# Patient Record
Sex: Male | Born: 1995 | Race: Black or African American | Hispanic: No | Marital: Single | Smoking: Never smoker
Health system: Southern US, Community
[De-identification: ages and names within clinical notes are randomized; demographics above are authoritative.]

## PROBLEM LIST (undated history)

## (undated) DIAGNOSIS — A549 Gonococcal infection, unspecified: Secondary | ICD-10-CM

## (undated) DIAGNOSIS — A539 Syphilis, unspecified: Secondary | ICD-10-CM

## (undated) DIAGNOSIS — B20 Human immunodeficiency virus [HIV] disease: Secondary | ICD-10-CM

## (undated) DIAGNOSIS — A749 Chlamydial infection, unspecified: Secondary | ICD-10-CM

---
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
## 2013-01-04 ENCOUNTER — Emergency Department (HOSPITAL_COMMUNITY)
Admission: EM | Admit: 2013-01-04 | Discharge: 2013-01-04 | Disposition: A | Discharge status: Home / Self Care | Payer: Medicaid Other | Attending: Emergency Medicine | Admitting: Emergency Medicine

## 2013-01-04 ENCOUNTER — Encounter (HOSPITAL_COMMUNITY): Payer: Self-pay | Admitting: *Deleted

## 2013-01-04 DIAGNOSIS — Z880 Allergy status to penicillin: Secondary | ICD-10-CM | POA: Insufficient documentation

## 2013-01-04 DIAGNOSIS — R000 Tachycardia, unspecified: Secondary | ICD-10-CM | POA: Insufficient documentation

## 2013-01-04 DIAGNOSIS — A64 Unspecified sexually transmitted disease: Secondary | ICD-10-CM | POA: Insufficient documentation

## 2013-01-04 DIAGNOSIS — K6289 Other specified diseases of anus and rectum: Secondary | ICD-10-CM | POA: Insufficient documentation

## 2013-01-04 DIAGNOSIS — Z711 Person with feared health complaint in whom no diagnosis is made: Secondary | ICD-10-CM

## 2013-01-04 LAB — URINE MICROSCOPIC-ADD ON

## 2013-01-04 LAB — URINALYSIS, ROUTINE W REFLEX MICROSCOPIC
Glucose, UA: NEGATIVE mg/dL
Ketones, ur: 40 mg/dL — AB
Leukocytes, UA: NEGATIVE
Nitrite: NEGATIVE
Protein, ur: 30 mg/dL — AB
Specific Gravity, Urine: 1.028 (ref 1.005–1.030)
Urobilinogen, UA: 1 mg/dL (ref 0.0–1.0)
pH: 6 (ref 5.0–8.0)

## 2013-01-04 MED ORDER — METRONIDAZOLE 500 MG PO TABS: 500.0000 mg | ORAL_TABLET | Freq: Two times a day (BID) | ORAL | Status: DC

## 2013-01-04 MED ORDER — OXYCODONE-ACETAMINOPHEN 5-325 MG PO TABS
2.0000 | ORAL_TABLET | Freq: Once | ORAL | Status: AC
  Administered 2013-01-04: 2 via ORAL
  Filled 2013-01-04: qty 2

## 2013-01-04 MED ORDER — CIPROFLOXACIN HCL 500 MG PO TABS: 500.0000 mg | ORAL_TABLET | Freq: Two times a day (BID) | ORAL | Status: DC

## 2013-01-04 MED ORDER — PERCOCET 5-325 MG PO TABS: 1.0000 | ORAL_TABLET | Freq: Four times a day (QID) | ORAL | Status: DC | PRN

## 2013-01-04 MED ORDER — AZITHROMYCIN 250 MG PO TABS
1000.0000 mg | ORAL_TABLET | Freq: Once | ORAL | Status: AC
  Administered 2013-01-04: 1000 mg via ORAL
  Filled 2013-01-04: qty 4

## 2013-01-04 MED ORDER — LIDOCAINE HCL (PF) 1 % IJ SOLN
INTRAMUSCULAR | Status: AC
  Administered 2013-01-04: 0.9 mL
  Filled 2013-01-04: qty 5

## 2013-01-04 MED ORDER — CEFTRIAXONE SODIUM 250 MG IJ SOLR
250.0000 mg | Freq: Once | INTRAMUSCULAR | Status: AC
  Administered 2013-01-04: 250 mg via INTRAMUSCULAR
  Filled 2013-01-04: qty 250

## 2013-01-04 NOTE — ED Provider Notes (Signed)
History     CSN: [id number]  Arrival date & time 01/04/13  4696   First MD Initiated Contact with Patient 01/04/13 0940      Chief Complaint  Patient presents with  . Rectal Pain    (Consider location/radiation/quality/duration/timing/severity/associated sxs/prior treatment) The history is provided by the patient.  Jesse Andrews is a 17 y.o. male that denies any past medical history and presents emergency department complaining of rectal pain.  Onset of symptoms began Sunday while having a bowel movement.  Patient reports that pain is worsened on the left side in comparison to the right.  He denies any blood in his stools or history of hemorrhoids.  Patient states that he is sexually active with males but has not had intercourse since October.  Patient denies any fevers, night sweats, chills, difficulty urinating, hematuria, dysuria, penile discharge, flank pain, back pain, urinary retention or abdominal pain.  History reviewed. No pertinent past medical history.  History reviewed. No pertinent past surgical history.  No family history on file.  History  Substance Use Topics  . Smoking status: Never Smoker   . Smokeless tobacco: Not on file  . Alcohol Use: Yes     Comment: occ      Review of Systems Ten systems reviewed and are negative for acute change, except as noted in the HPI.    Allergies  Penicillins  Home Medications  No current outpatient prescriptions on file.  BP 112/79  Pulse 112  Temp(Src) 98 F (36.7 C) (Oral)  Resp 18  SpO2 100%  Physical Exam  Nursing note and vitals reviewed. Constitutional: He is oriented to person, place, and time. He appears well-developed and well-nourished. No distress.  HENT:  Head: Normocephalic and atraumatic.  Eyes: Conjunctivae and EOM are normal.  Neck: Normal range of motion.  Cardiovascular:  Tachycardic.  Intact distal pulses.  No pitting edema.  Pulmonary/Chest: Effort normal.  Abdominal:  Soft  nontender abdomen.  Genitourinary:  Exam chaperoned.  No palpable fluctuance or hernias. Mild purulent drainage. GC/Chlamydia cultures sent.   Musculoskeletal: Normal range of motion.  Neurological: He is alert and oriented to person, place, and time.  Skin: Skin is warm and dry. No rash noted. He is not diaphoretic.  Psychiatric: He has a normal mood and affect. His behavior is normal.    ED Course  Procedures (including critical care time)  Labs Reviewed - No data to display No results found.   No diagnosis found.   consult to pharmacy for dc abx: Cipro & flagyl   MDM   Pt to ER c/o rectal pain w BM on Sun, gradually worsening. Pt engaged in unprotected anal sex in October, denies co-morbidities or any immunocompromised illness. No evidence of hernia or abscess on rectal exam. Cultures sent. UA without signs of UTI. Will treat for STD & dc w cipro/flagyl. Follow up w PCP. Pain medication at dc. Strict return precautions & protected sex discussed.         Jaci Carrel, [place] 01/04/13 1240  Medical screening examination/treatment/procedure(s) were conducted as a shared visit with non-physician practitioner(s) and myself.  I personally evaluated the patient during the encounter  Derwood Kaplan, MD 01/04/13 1645

## 2013-01-04 NOTE — ED Notes (Signed)
Pt has a urine cup and knows we need a urine sample 

## 2013-01-04 NOTE — ED Notes (Signed)
Pt is here with rectal pain that started on Sunday when having a bowel movement.  No bleeding

## 2013-01-05 LAB — URINE CULTURE

## 2013-01-06 LAB — GC/CHLAMYDIA PROBE AMP
CT Probe RNA: POSITIVE — AB
GC Probe RNA: POSITIVE — AB

## 2013-01-07 ENCOUNTER — Telehealth (HOSPITAL_COMMUNITY): Payer: Self-pay | Admitting: Emergency Medicine

## 2013-01-07 NOTE — ED Notes (Signed)
+  Chlamydia. +Gonorrhea. Patient treated with Rocephin and Zithromax. [employer] faxed. 

## 2013-01-07 NOTE — ED Notes (Signed)
Patient has +Chalmydia and +Gonorrhea.

## 2013-08-17 DIAGNOSIS — B20 Human immunodeficiency virus [HIV] disease: Secondary | ICD-10-CM

## 2013-08-17 HISTORY — DX: Human immunodeficiency virus (HIV) disease: B20

## 2013-11-16 ENCOUNTER — Emergency Department (HOSPITAL_COMMUNITY)
Admission: EM | Admit: 2013-11-16 | Discharge: 2013-11-16 | Disposition: A | Discharge status: Home / Self Care | Payer: BC Managed Care – PPO | Attending: Emergency Medicine | Admitting: Emergency Medicine

## 2013-11-16 ENCOUNTER — Encounter (HOSPITAL_COMMUNITY): Payer: Self-pay | Admitting: Emergency Medicine

## 2013-11-16 DIAGNOSIS — K649 Unspecified hemorrhoids: Secondary | ICD-10-CM

## 2013-11-16 DIAGNOSIS — K644 Residual hemorrhoidal skin tags: Secondary | ICD-10-CM | POA: Insufficient documentation

## 2013-11-16 MED ORDER — HYDROCORTISONE 1 % EX CREA
1.0000 | TOPICAL_CREAM | Freq: Two times a day (BID) | CUTANEOUS | Status: DC
Start: 2013-11-16 — End: 2013-12-11

## 2013-11-16 NOTE — ED Notes (Signed)
Pt c/o itching and discomfort to anal area since Monday. Pt states it is uncomfortable to have a bowel movement. Pt denies hx of hemorrhoids.

## 2013-11-16 NOTE — ED Provider Notes (Signed)
CSN: [id number]     Arrival date & time 11/16/13  1725 History  This chart was scribed for non-physician practitioner Roxy Horsemanobert Nereida Schepp, PA-C working with Hurman HornJohn M Bednar, MD by Joaquin MusicKristina Sanchez-Matthews, ED Scribe. This patient was seen in room WTR7/WTR7 and the patient's care was started at 6:30 PM .  Chief Complaint  Patient presents with  . Rectal Pain/Itching    The history is provided by the patient. No language interpreter was used.   HPI Comments: Jesse Andrews is a 18 y.o. male who presents to the Emergency Department complaining of ongoing itching and discomfort to anal area that began 4 days ago. He reports having a BM 4 days ago and reports feeling discomfort and pain shortly after.  Pt reports having anal sex that took place October 30, 2013. He states "this is the first time he has experienced this pain". Pt denies hx of hemorrhoids. Pt denies rectal bleeding or hematochezia.  No past medical history on file. No past surgical history on file. No family history on file. History  Substance Use Topics  . Smoking status: Never Smoker   . Smokeless tobacco: Not on file  . Alcohol Use: Yes     Comment: occ    Review of Systems  Constitutional: Positive for activity change. Negative for fever and chills.  Respiratory: Negative for shortness of breath.   Cardiovascular: Negative for chest pain.  Gastrointestinal: Negative for nausea, vomiting, abdominal pain, diarrhea, constipation, blood in stool and anal bleeding.  Genitourinary: Negative for dysuria.   Allergies  Penicillins  Home Medications  No current outpatient prescriptions on file.  Triage Vitals:BP 131/66  Pulse 72  Temp(Src) 98.9 F (37.2 C) (Oral)  Resp 16  SpO2 100%  Physical Exam  Nursing note and vitals reviewed. Constitutional: He is oriented to person, place, and time. He appears well-developed and well-nourished. No distress.  HENT:  Head: Normocephalic and atraumatic.  Eyes: EOM are normal.   Neck: Neck supple. No tracheal deviation present.  Cardiovascular: Normal rate.   Pulmonary/Chest: Effort normal. No respiratory distress.  Genitourinary:  Rectal exam. Chaperon present. Small hemorhorid  at the 7 o'clock position. No abscess or fissure.  Musculoskeletal: Normal range of motion.  Neurological: He is alert and oriented to person, place, and time.  Skin: Skin is warm and dry.  Psychiatric: He has a normal mood and affect. His behavior is normal.   ED Course  Procedures  DIAGNOSTIC STUDIES: Oxygen Saturation is 100% on RA, normal by my interpretation.    COORDINATION OF CARE: 6:34 PM-Discussed treatment plan which includes advised pt to increase water intake and use Preparation H for pain and itching. Pt agreed to plan.   Labs Review Labs Reviewed - No data to display Imaging Review No results found.   EKG Interpretation None     MDM   Final diagnoses:  Hemorrhoid   Patient with small visible hemorrhoid and anal pruritus.  No abscess, fissure, or other complicating factor.  Will discharge to home with preparation H.  Patient is stable and ready for discharge.  I personally performed the services described in this documentation, which was scribed in my presence. The recorded information has been reviewed and is accurate.     Roxy Horsemanobert Thang Flett, PA-C 11/16/13 1902

## 2013-11-16 NOTE — Discharge Instructions (Signed)
Hemorrhoids Hemorrhoids are swollen veins around the rectum or anus. There are two types of hemorrhoids:   Internal hemorrhoids. These occur in the veins just inside the rectum. They may poke through to the outside and become irritated and painful.  External hemorrhoids. These occur in the veins outside the anus and can be felt as a painful swelling or hard lump near the anus. CAUSES  Pregnancy.   Obesity.   Constipation or diarrhea.   Straining to have a bowel movement.   Sitting for long periods on the toilet.  Heavy lifting or other activity that caused you to strain.  Anal intercourse. SYMPTOMS   Pain.   Anal itching or irritation.   Rectal bleeding.   Fecal leakage.   Anal swelling.   One or more lumps around the anus.  DIAGNOSIS  Your caregiver may be able to diagnose hemorrhoids by visual examination. Other examinations or tests that may be performed include:   Examination of the rectal area with a gloved hand (digital rectal exam).   Examination of anal canal using a small tube (scope).   A blood test if you have lost a significant amount of blood.  A test to look inside the colon (sigmoidoscopy or colonoscopy). TREATMENT Most hemorrhoids can be treated at home. However, if symptoms do not seem to be getting better or if you have a lot of rectal bleeding, your caregiver may perform a procedure to help make the hemorrhoids get smaller or remove them completely. Possible treatments include:   Placing a rubber band at the base of the hemorrhoid to cut off the circulation (rubber band ligation).   Injecting a chemical to shrink the hemorrhoid (sclerotherapy).   Using a tool to burn the hemorrhoid (infrared light therapy).   Surgically removing the hemorrhoid (hemorrhoidectomy).   Stapling the hemorrhoid to block blood flow to the tissue (hemorrhoid stapling).  HOME CARE INSTRUCTIONS   Eat foods with fiber, such as whole grains, beans,  nuts, fruits, and vegetables. Ask your doctor about taking products with added fiber in them (fibersupplements).  Increase fluid intake. Drink enough water and fluids to keep your urine clear or pale yellow.   Exercise regularly.   Go to the bathroom when you have the urge to have a bowel movement. Do not wait.   Avoid straining to have bowel movements.   Keep the anal area dry and clean. Use wet toilet paper or moist towelettes after a bowel movement.   Medicated creams and suppositories may be used or applied as directed.   Only take over-the-counter or prescription medicines as directed by your caregiver.   Take warm sitz baths for 15 20 minutes, 3 4 times a day to ease pain and discomfort.   Place ice packs on the hemorrhoids if they are tender and swollen. Using ice packs between sitz baths may be helpful.   Put ice in a plastic bag.   Place a towel between your skin and the bag.   Leave the ice on for 15 20 minutes, 3 4 times a day.   Do not use a donut-shaped pillow or sit on the toilet for long periods. This increases blood pooling and pain.  SEEK MEDICAL CARE IF:  You have increasing pain and swelling that is not controlled by treatment or medicine.  You have uncontrolled bleeding.  You have difficulty or you are unable to have a bowel movement.  You have pain or inflammation outside the area of the hemorrhoids. MAKE SURE YOU:    Understand these instructions.  Will watch your condition.  Will get help right away if you are not doing well or get worse. Document Released: 07/31/2000 Document Revised: 07/20/2012 Document Reviewed: 06/07/2012 ExitCare Patient Information 2014 [employer].  

## 2013-11-17 NOTE — ED Provider Notes (Signed)
Medical screening examination/treatment/procedure(s) were performed by non-physician practitioner and as supervising physician I was immediately available for consultation/collaboration.     Morty Ortwein M Karsyn Rochin, MD 11/17/13 1403 

## 2013-12-11 ENCOUNTER — Emergency Department (HOSPITAL_COMMUNITY)
Admission: EM | Admit: 2013-12-11 | Discharge: 2013-12-11 | Disposition: A | Discharge status: Home / Self Care | Payer: BC Managed Care – PPO | Attending: Emergency Medicine | Admitting: Emergency Medicine

## 2013-12-11 ENCOUNTER — Encounter (HOSPITAL_COMMUNITY): Payer: Self-pay | Admitting: Emergency Medicine

## 2013-12-11 DIAGNOSIS — R21 Rash and other nonspecific skin eruption: Secondary | ICD-10-CM | POA: Insufficient documentation

## 2013-12-11 DIAGNOSIS — Z791 Long term (current) use of non-steroidal anti-inflammatories (NSAID): Secondary | ICD-10-CM | POA: Insufficient documentation

## 2013-12-11 DIAGNOSIS — Z880 Allergy status to penicillin: Secondary | ICD-10-CM | POA: Insufficient documentation

## 2013-12-11 DIAGNOSIS — Z8619 Personal history of other infectious and parasitic diseases: Secondary | ICD-10-CM | POA: Insufficient documentation

## 2013-12-11 MED ORDER — HYDROCORTISONE 1 % EX CREA
TOPICAL_CREAM | CUTANEOUS | Status: DC
Start: 2013-12-11 — End: 2021-09-10

## 2013-12-11 MED ORDER — DEXAMETHASONE SODIUM PHOSPHATE 10 MG/ML IJ SOLN
10.0000 mg | Freq: Once | INTRAMUSCULAR | Status: AC
  Administered 2013-12-11: 10 mg via INTRAMUSCULAR
  Filled 2013-12-11: qty 1

## 2013-12-11 NOTE — Discharge Instructions (Signed)
Apply hydrocortisone cream to the affected area except for your genital area. It is very important for you to thoroughly wash all of your clothes and sheets separate from everything else.  Rash A rash is a change in the color or texture of your skin. There are many different types of rashes. You may have other problems that accompany your rash. CAUSES   Infections.  Allergic reactions. This can include allergies to pets or foods.  Certain medicines.  Exposure to certain chemicals, soaps, or cosmetics.  Heat.  Exposure to poisonous plants.  Tumors, both cancerous and noncancerous. SYMPTOMS   Redness.  Scaly skin.  Itchy skin.  Dry or cracked skin.  Bumps.  Blisters.  Pain. DIAGNOSIS  Your caregiver may do a physical exam to determine what type of rash you have. A skin sample (biopsy) may be taken and examined under a microscope. TREATMENT  Treatment depends on the type of rash you have. Your caregiver may prescribe certain medicines. For serious conditions, you may need to see a skin doctor (dermatologist). HOME CARE INSTRUCTIONS   Avoid the substance that caused your rash.  Do not scratch your rash. This can cause infection.  You may take cool baths to help stop itching.  Only take over-the-counter or prescription medicines as directed by your caregiver.  Keep all follow-up appointments as directed by your caregiver. SEEK IMMEDIATE MEDICAL CARE IF:  You have increasing pain, swelling, or redness.  You have a fever.  You have new or severe symptoms.  You have body aches, diarrhea, or vomiting.  Your rash is not better after 3 days. MAKE SURE YOU:  Understand these instructions.  Will watch your condition.  Will get help right away if you are not doing well or get worse. Document Released: 07/24/2002 Document Revised: 10/26/2011 Document Reviewed: 05/18/2011 [facility] Patient Information 2014 [place], [place].

## 2013-12-11 NOTE — ED Notes (Signed)
Patient has raised rash on bilateral arms and "private area".  Patient denies pain or itching.   Patient states started approximately 1 week ago.  Patient states noone else in home has rash.

## 2013-12-11 NOTE — ED Provider Notes (Signed)
CSN: [id number]     Arrival date & time 12/11/13  1141 History  This chart was scribed for non-physician practitioner, Johnnette Gourdobyn Albert, PA-C, working with Richardean Canalavid H Yao, MD, by Ellin MayhewMichael Levi, ED Scribe. This patient was seen in room TR05C/TR05C and the patient's care was started at 12:32 PM.  The history is provided by the patient. No language interpreter was used.   HPI Comments: Jesse Andrews is a 18 y.o. male who presents to the Emergency Department with a chief complaint of a rash to the arms, bilaterally, and genital area with onset 1 weeks ago. Patient denies any pain or itching. Patient reports the rash began in his genital region and has been spreading to his extremities and progressively worsening. He denies any fever, nausea, vomiting or diarrhea, abdominal pain, or any other symptoms. He states he was last sexually active on 09/30/2013 with one male partner and has not had intercourse since then. Patient has a history of gonorrhea and chlamydia last year. Patient has recently had negative complete STD lab results read 2 weeks ago before the rash presented. He was tested for HIV, syphilis, herpes, gonorrhea and Chlamydia. He denies any new medications. Patient resides in a dormitory at [employer] and denies any roommates with similar symptoms. Patient confirms an ant problem in his dormitory. Patient denies any recent changes in hygenic products or new pets.  History reviewed. No pertinent past medical history. History reviewed. No pertinent past surgical history. No family history on file. History  Substance Use Topics  . Smoking status: Never Smoker   . Smokeless tobacco: Not on file  . Alcohol Use: Yes     Comment: occ    Review of Systems  Constitutional: Negative for fever, chills and diaphoresis.  HENT: Negative for sore throat.   Respiratory: Negative for cough and shortness of breath.   Cardiovascular: Negative for chest pain.  Gastrointestinal: Negative for nausea,  vomiting, abdominal pain and diarrhea.  Genitourinary: Negative for dysuria, discharge, penile swelling, scrotal swelling, penile pain and testicular pain.  Musculoskeletal: Negative for arthralgias, back pain and neck pain.  Skin: Positive for rash.  Neurological: Negative for weakness and headaches.  A complete 10 system review of systems was obtained and all systems are negative except as noted in the HPI and PMH.   Allergies  Penicillins  Home Medications   Prior to Admission medications   Medication Sig Start Date End Date Taking? Authorizing Provider  hydrocortisone cream 1 % Apply 1 application topically 2 (two) times daily. 11/16/13   Roxy Horsemanobert Browning, PA-C  ibuprofen (ADVIL,MOTRIN) 200 MG tablet Take 400 mg by mouth every 6 (six) hours as needed for moderate pain.    Historical Provider, MD  naphazoline-pheniramine (NAPHCON-A) 0.025-0.3 % ophthalmic solution Place 1 drop into both eyes as needed for irritation.    Historical Provider, MD   Triage Vitals: BP 110/70  Pulse 53  Temp(Src) 98.2 F (36.8 C) (Oral)  Resp 16  Ht 6\' 2"  (1.88 m)  Wt 160 lb (72.576 kg)  BMI 20.53 kg/m2  SpO2 100%  Physical Exam  Nursing note and vitals reviewed. Constitutional: He is oriented to person, place, and time. He appears well-developed and well-nourished. No distress.  HENT:  Head: Normocephalic and atraumatic.  Eyes: Conjunctivae and EOM are normal.  Neck: Normal range of motion. Neck supple.  Cardiovascular: Normal rate, regular rhythm and normal heart sounds.   Pulmonary/Chest: Effort normal and breath sounds normal.  Genitourinary: Right testis shows no mass, no  swelling and no tenderness. Left testis shows no mass, no swelling and no tenderness. No penile tenderness. No discharge found.  Musculoskeletal: Normal range of motion. He exhibits no edema.  Neurological: He is alert and oriented to person, place, and time.  Skin: Skin is warm and dry. Lesion and rash noted. Rash is macular  and papular. Rash is not pustular and not urticarial.  Scattered, macular and papular skin colored lesions on chest, abdomen, bilateral arms, penile shaft and testicles. No signs of secondary infection.  Psychiatric: He has a normal mood and affect. His behavior is normal.   ED Course  Procedures (including critical care time)  COORDINATION OF CARE: 12:46 PM-Discussed my low suspicion of STDs and my suspicion of insect bites. Will administer Decadron injection 10mg .Treatment plan discussed with patient and patient agrees.  MDM   Final diagnoses:  Rash    Patient presenting with rash. He is well appearing and in no apparent distress. Afebrile, stable vital signs. No signs of secondary infection. He was recently tested for STDs 2 weeks ago, has not had intercourse since. States he has not had intercourse for 2 months. Rash is consistent with bedbugs, and given and infestation this is very likely. Will treat with a shot of steroids and hydrocortisone cream. Followup with [facility] health [facility] at school. Stable for discharge. Return precautions given. Patient states understanding of treatment care plan and is agreeable.   I personally performed the services described in this documentation, which was scribed in my presence. The recorded information has been reviewed and is accurate.    [name], PA-C 12/11/13 1301

## 2013-12-12 NOTE — ED Provider Notes (Signed)
Medical screening examination/treatment/procedure(s) were performed by non-physician practitioner and as supervising physician I was immediately available for consultation/collaboration.   EKG Interpretation None        Barrie Wale H Izzac Rockett, MD 12/12/13 0710 

## 2013-12-25 ENCOUNTER — Emergency Department (HOSPITAL_COMMUNITY)
Admission: EM | Admit: 2013-12-25 | Discharge: 2013-12-25 | Disposition: A | Discharge status: Home / Self Care | Payer: BC Managed Care – PPO | Attending: Emergency Medicine | Admitting: Emergency Medicine

## 2013-12-25 ENCOUNTER — Telehealth (HOSPITAL_BASED_OUTPATIENT_CLINIC_OR_DEPARTMENT_OTHER): Payer: Self-pay | Admitting: Emergency Medicine

## 2013-12-25 DIAGNOSIS — Z880 Allergy status to penicillin: Secondary | ICD-10-CM | POA: Insufficient documentation

## 2013-12-25 DIAGNOSIS — A630 Anogenital (venereal) warts: Secondary | ICD-10-CM | POA: Insufficient documentation

## 2013-12-25 DIAGNOSIS — R21 Rash and other nonspecific skin eruption: Secondary | ICD-10-CM

## 2013-12-25 DIAGNOSIS — IMO0002 Reserved for concepts with insufficient information to code with codable children: Secondary | ICD-10-CM | POA: Insufficient documentation

## 2013-12-25 LAB — HIV ANTIBODY (ROUTINE TESTING W REFLEX): HIV 1&2 Ab, 4th Generation: REACTIVE — AB

## 2013-12-25 LAB — RPR TITER: RPR Titer: 1:64 {titer} — AB

## 2013-12-25 LAB — HIV 1/2 CONFIRMATION
HIV 1 ANTIBODY: POSITIVE — AB
HIV-2 Ab: NEGATIVE

## 2013-12-25 LAB — RPR: RPR: REACTIVE — AB

## 2013-12-25 MED ORDER — AZITHROMYCIN 250 MG PO TABS
1000.0000 mg | ORAL_TABLET | Freq: Once | ORAL | Status: AC
  Administered 2013-12-25: 1000 mg via ORAL
  Filled 2013-12-25: qty 4

## 2013-12-25 MED ORDER — LIDOCAINE HCL 1 % IJ SOLN
INTRAMUSCULAR | Status: AC
Start: 2013-12-25 — End: 2013-12-25
  Administered 2013-12-25: 0.9 mL
  Filled 2013-12-25: qty 20

## 2013-12-25 MED ORDER — CEFTRIAXONE SODIUM 250 MG IJ SOLR
250.0000 mg | Freq: Once | INTRAMUSCULAR | Status: AC
  Administered 2013-12-25: 250 mg via INTRAMUSCULAR
  Filled 2013-12-25: qty 250

## 2013-12-25 NOTE — Telephone Encounter (Signed)
Lab called + HIV. Dr Luciana Axeomer with ID notified of + HIV result

## 2013-12-25 NOTE — ED Provider Notes (Signed)
CSN: [id number]     Arrival date & time 12/25/13  1059 History   First MD Initiated Contact with Patient 12/25/13 1121     Chief Complaint  Patient presents with  . Pruritis  . Rash     (Consider location/radiation/quality/duration/timing/severity/associated sxs/prior Treatment) HPI Comments: Patient presents today with a chief complaint of rash.  He reports that the rash has been present for 2 weeks and is unchanged from onset.  Rash located on palms, soles, and both forearms.  He was seen in the ED for the same on 12/11/13 and was diagnosed with Bed Bugs.  He reports that he has been putting Hydrocortisone on the rash with no relief.  He reports that the rash is not painful and does not itch.  Denies any fever, chills, headache, or sore throat.    Patient also reports that he has had some anal itching over the past few days.  Denies pain.  He does report some discomfort with defecation.  Denies any blood in his stool.  Denies any history of Herpes or Genital Warts.  He is sexually active and does engage in anal sex without protection.    The history is provided by the patient.    No past medical history on file. No past surgical history on file. No family history on file. History  Substance Use Topics  . Smoking status: Never Smoker   . Smokeless tobacco: Not on file  . Alcohol Use: Yes     Comment: occ    Review of Systems  Constitutional: Negative for fever and chills.  Genitourinary: Negative for dysuria, discharge, penile swelling, scrotal swelling and penile pain.       Anal itching  Skin: Positive for rash.  Neurological: Negative for headaches.      Allergies  Penicillins  Home Medications   Prior to Admission medications   Medication Sig Start Date End Date Taking? Authorizing Provider  hydrocortisone cream 1 % Apply to affected area 2 times daily 12/11/13  Yes Trevor Maceobyn M Albert, PA-C  ibuprofen (ADVIL,MOTRIN) 200 MG tablet Take 400 mg by mouth every 6 (six) hours  as needed for moderate pain.   Yes Historical Provider, MD  tetrahydrozoline (VISINE) 0.05 % ophthalmic solution Place 2 drops into both eyes as needed.   Yes Historical Provider, MD   BP 136/74  Pulse 76  Temp(Src) 98.4 F (36.9 C) (Oral)  Resp 18  SpO2 100% Physical Exam  Nursing note and vitals reviewed. Constitutional: He appears well-developed and well-nourished.  HENT:  Head: Normocephalic and atraumatic.  Mouth/Throat: Oropharynx is clear and moist.  Cardiovascular: Normal rate, regular rhythm and normal heart sounds.   Pulmonary/Chest: Effort normal and breath sounds normal.  Genitourinary: Testes normal. No penile tenderness. No discharge found.  Chaperone present Several small Perirectal pink colored papules.  No drainage.   Pink colored papules located on the shaft of the penis   Neurological: He is alert.  Skin: Skin is warm and dry.  Erythematous macular rash located on the palms and soles of the feet bilaterally  Psychiatric: He has a normal mood and affect.    ED Course  Procedures (including critical care time) Labs Review Labs Reviewed  GC/CHLAMYDIA PROBE AMP  RPR  HIV ANTIBODY (ROUTINE TESTING)    Imaging Review No results found.   EKG Interpretation None      MDM   Final diagnoses:  None   Patient presenting with rash and anal itching.  Rash located on the palms  and soles.  Suspicious for possible Syphilis.  RPR pending.  STD panel ordered.  HIV, Gonorrhea, and Chlamydia also pending.  Patient given prophylactic Ceftriaxone and Azithromycin in the ED.  Patient educated on safe sex practices.  GU exam consistent with Genital Warts.  Patient stable for discharge.  Return precautions given.    Santiago GladHeather Oryn Casanova, PA-C 12/28/13 1254

## 2013-12-25 NOTE — ED Notes (Signed)
Pt to ED with c/o burning and itching to rectum and rash to both hands.  Pt recently seen for the same and dx with bed bugs.  Pt is concerned that his symptoms are not resolving.

## 2013-12-26 ENCOUNTER — Telehealth (HOSPITAL_BASED_OUTPATIENT_CLINIC_OR_DEPARTMENT_OTHER): Payer: Self-pay | Admitting: Emergency Medicine

## 2013-12-26 LAB — GC/CHLAMYDIA PROBE AMP
CT PROBE, AMP APTIMA: NEGATIVE
GC Probe RNA: NEGATIVE

## 2013-12-26 LAB — T.PALLIDUM AB, IGG

## 2013-12-26 NOTE — Telephone Encounter (Signed)
+   RPR, chart sent to EDP for review

## 2013-12-28 ENCOUNTER — Telehealth: Payer: Self-pay

## 2013-12-28 DIAGNOSIS — A549 Gonococcal infection, unspecified: Secondary | ICD-10-CM

## 2013-12-28 DIAGNOSIS — A749 Chlamydial infection, unspecified: Secondary | ICD-10-CM

## 2013-12-28 DIAGNOSIS — A539 Syphilis, unspecified: Secondary | ICD-10-CM

## 2013-12-28 DIAGNOSIS — B20 Human immunodeficiency virus [HIV] disease: Secondary | ICD-10-CM | POA: Insufficient documentation

## 2013-12-28 NOTE — Telephone Encounter (Signed)
[facility] has not been informed of his HIV results.   Could you  please call him with results so I can set up intake.  Phone number: [phone]  Thanks   Laurell Josephsammy K Abimelec Grochowski, RN

## 2013-12-29 ENCOUNTER — Telehealth: Payer: Self-pay | Admitting: Infectious Diseases

## 2013-12-29 ENCOUNTER — Telehealth: Payer: Self-pay

## 2013-12-29 NOTE — Telephone Encounter (Signed)
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 He needs to come to ID clinic to have f/u testing.  Will have our clinic staff call him, he is expecting their call.

## 2013-12-29 NOTE — Telephone Encounter (Signed)
Message left on machine with date and time for new patient intake. Please return call to confirm receipt of message.   Laurell Josephsammy K King, RN

## 2013-12-29 NOTE — Telephone Encounter (Signed)
Discussed with pt.  Can you call him back and set up appt for intake?  i'd be glad to see him as a new pt in June Thanks jeff

## 2013-12-30 NOTE — ED Provider Notes (Signed)
Medical screening examination/treatment/procedure(s) were performed by non-physician practitioner and as supervising physician I was immediately available for consultation/collaboration.    Celene KrasJon R Kmya Placide, MD 12/30/13 [phone]

## 2014-01-18 ENCOUNTER — Ambulatory Visit: Payer: BC Managed Care – PPO

## 2014-07-09 ENCOUNTER — Emergency Department (HOSPITAL_COMMUNITY)
Admission: EM | Admit: 2014-07-09 | Discharge: 2014-07-09 | Disposition: A | Discharge status: Home / Self Care | Payer: BC Managed Care – PPO | Source: Home / Self Care | Attending: Family Medicine | Admitting: Family Medicine

## 2014-07-09 ENCOUNTER — Encounter (HOSPITAL_COMMUNITY): Payer: Self-pay | Admitting: Emergency Medicine

## 2014-07-09 DIAGNOSIS — J0110 Acute frontal sinusitis, unspecified: Secondary | ICD-10-CM

## 2014-07-09 DIAGNOSIS — B20 Human immunodeficiency virus [HIV] disease: Secondary | ICD-10-CM

## 2014-07-09 DIAGNOSIS — Z21 Asymptomatic human immunodeficiency virus [HIV] infection status: Secondary | ICD-10-CM

## 2014-07-09 HISTORY — DX: Human immunodeficiency virus (HIV) disease: B20

## 2014-07-09 LAB — CBC WITH DIFFERENTIAL/PLATELET
BAND NEUTROPHILS: 0 % (ref 0–10)
BASOS ABS: 0 10*3/uL (ref 0.0–0.1)
BASOS PCT: 0 % (ref 0–1)
Blasts: 0 %
EOS ABS: 0 10*3/uL (ref 0.0–0.7)
EOS PCT: 0 % (ref 0–5)
HEMATOCRIT: 47.3 % (ref 39.0–52.0)
HEMOGLOBIN: 16.9 g/dL (ref 13.0–17.0)
LYMPHS ABS: 1.9 10*3/uL (ref 0.7–4.0)
LYMPHS PCT: 47 % — AB (ref 12–46)
MCH: 30.7 pg (ref 26.0–34.0)
MCHC: 35.7 g/dL (ref 30.0–36.0)
MCV: 86 fL (ref 78.0–100.0)
MONO ABS: 0.5 10*3/uL (ref 0.1–1.0)
MONOS PCT: 11 % (ref 3–12)
Metamyelocytes Relative: 0 %
Myelocytes: 0 %
NEUTROS ABS: 1.8 10*3/uL (ref 1.7–7.7)
NEUTROS PCT: 42 % — AB (ref 43–77)
Platelets: 103 10*3/uL — ABNORMAL LOW (ref 150–400)
Promyelocytes Absolute: 0 %
RBC: 5.5 MIL/uL (ref 4.22–5.81)
RDW: 13 % (ref 11.5–15.5)
WBC: 4.2 10*3/uL (ref 4.0–10.5)
nRBC: 0 /100 WBC

## 2014-07-09 LAB — POCT RAPID STREP A: STREPTOCOCCUS, GROUP A SCREEN (DIRECT): NEGATIVE

## 2014-07-09 MED ORDER — DOXYCYCLINE HYCLATE 100 MG PO TABS: 100.0000 mg | ORAL_TABLET | Freq: Two times a day (BID) | ORAL | Status: DC

## 2014-07-09 MED ORDER — CETIRIZINE HCL 10 MG PO TABS: 10.0000 mg | ORAL_TABLET | Freq: Every day | ORAL | Status: DC

## 2014-07-09 MED ORDER — BENZONATATE 100 MG PO CAPS: 100.0000 mg | ORAL_CAPSULE | Freq: Three times a day (TID) | ORAL | Status: DC | PRN

## 2014-07-09 NOTE — ED Notes (Signed)
3 lav tubes sent to lab with manual request for HIV viral load

## 2014-07-09 NOTE — ED Provider Notes (Signed)
CSN: [id number]     Arrival date & time 07/09/14  1255 History   First MD Initiated Contact with Patient 07/09/14 1424     Chief Complaint  Patient presents with  . Generalized Body Aches  . Sore Throat  . sinus pressure   . Cough  . Fever   (Consider location/radiation/quality/duration/timing/severity/associated sxs/prior Treatment) Patient is a 18 y.o. male presenting with URI.  URI Presenting symptoms: congestion, cough, facial pain, fever, rhinorrhea and sore throat   Severity:  Moderate Onset quality:  Gradual Duration:  3 days Timing:  Constant Progression:  Worsening Chronicity:  New Relieved by:  Nothing Ineffective treatments:  OTC medications Risk factors comment:  HIV  Patient is a 18 yo male with a history of HIV presenting for URI symptoms. Patient notes onset of congestion, rhinorrhea, and body aches on Saturday. He notes sinus pressure and fevers as well. He checked his temperature, though is unsure what it was. He notes post-nasal drip and cough as well. Denies shortness of breath. He notes chills. Feels as though his symptoms are getting worse. Has taken tylenol sinus with no benefit. Has been drinking water, though has not felt like eating since Saturday. He has vomited once. He notes he is aware of his HIV status, though has not had an appointment with ID or been started on medication for this. Denies sick contacts.   Past Medical History  Diagnosis Date  . HIV disease    History reviewed. No pertinent past surgical history. History reviewed. No pertinent family history. History  Substance Use Topics  . Smoking status: Never Smoker   . Smokeless tobacco: Not on file  . Alcohol Use: Yes     Comment: occ    Review of Systems  Constitutional: Positive for fever.  HENT: Positive for congestion, rhinorrhea and sore throat.   Respiratory: Positive for cough. Negative for shortness of breath.   Gastrointestinal: Positive for vomiting.    Allergies   Penicillins  Home Medications   Prior to Admission medications   Medication Sig Start Date End Date Taking? Authorizing Provider  Chlorphen-Phenyleph-APAP (TYLENOL SINUS CONGESTION/PAIN) 2-5-325 MG TABS Take by mouth.   Yes Historical Provider, MD  benzonatate (TESSALON) 100 MG capsule Take 1 capsule (100 mg total) by mouth 3 (three) times daily as needed for cough. 07/09/14   Glori LuisEric G Rivaldo Hineman, MD  cetirizine (ZYRTEC) 10 MG tablet Take 1 tablet (10 mg total) by mouth daily. 07/09/14   Glori LuisEric G Harla Mensch, MD  doxycycline (VIBRA-TABS) 100 MG tablet Take 1 tablet (100 mg total) by mouth 2 (two) times daily. 07/09/14   Glori LuisEric G Sophiah Rolin, MD  hydrocortisone cream 1 % Apply to affected area 2 times daily 12/11/13   Nada Boozerobyn M Hess, PA-C  ibuprofen (ADVIL,MOTRIN) 200 MG tablet Take 400 mg by mouth every 6 (six) hours as needed for moderate pain.    Historical Provider, MD  tetrahydrozoline (VISINE) 0.05 % ophthalmic solution Place 2 drops into both eyes as needed.    Historical Provider, MD   BP 139/82 mmHg  Pulse 122  Temp(Src) 101.1 F (38.4 C) (Oral)  Resp 16  SpO2 99% Physical Exam  Constitutional: He appears well-developed and well-nourished. No distress.  HENT:  Head: Normocephalic and atraumatic.  Bilateral TM normal, posterior OP with cobblestoning and erythema, no tonsillar swelling or exudate  Neck: Neck supple.  Cardiovascular: Regular rhythm and normal heart sounds.  Exam reveals no gallop and no friction rub.   No murmur heard. Tachycardic  Pulmonary/Chest:  Effort normal and breath sounds normal. No respiratory distress. He has no wheezes. He has no rales.  Musculoskeletal: He exhibits no edema.  Lymphadenopathy:    He has no cervical adenopathy.  Neurological: He is alert.  Skin: Skin is warm and dry.  Psychiatric: His behavior is normal. Thought content normal.    ED Course  Procedures (including critical care time) Labs Review Labs Reviewed  CBC WITH DIFFERENTIAL   T-HELPER CELLS (CD4) COUNT  HIV 1 RNA QUANT-NO REFLEX-BLD  POCT RAPID STREP A ([facility] CARE ONLY)    Imaging Review No results found.   MDM   1. Acute frontal sinusitis, recurrence not specified   2. HIV (human immunodeficiency virus infection)    Patient is a 18 yo male with signs and symptoms of URI/acute sinusitis. Patient is febrile and tachycardic. No blood pressure abnormalities. Non-toxic appearing. He has normal lung sounds and normal oxygen saturation making PNA an unlikely cause. No exudates and negative rapid strep make strep throat unlikely. He does have a history of HIV and I discussed with him that this could also be his HIV worsening. With the signs and symptoms of acute sinusitis we will treat him for this with antibiotics. We will obtain a CBC with diff, HIV viral load, and CD4 count to evaluate his HIV and infection. Will treat with doxycycline for acute bacterial sinusitis given his penicillin allergy. Tessalon for cough and zyrtec. Patient was advised that he needed to call ID/Dr Hatcher's office on discharge from urgent care to advise them of symptoms and get scheduled for a follow-up appointment. Given return precautions.   This patient was discussed with and seen by Dr Konrad DoloresMerrell. He helped formulate the plan of care for this patient.   Marikay AlarEric Ugochi Henzler, MD [facility] PGY3    Glori LuisEric G Vasilis Luhman, MD 07/09/14 [phone]

## 2014-07-09 NOTE — Discharge Instructions (Signed)
You likely have a sinus infection.  This could be due to a virus or a bacteria.  This could also be related to your HIV getting worse.  You need to call Dr Moshe CiproHatcher's office as soon as you leave this office to schedule a follow-up appointment. Please let them know what symptoms you are having when you call and that we are treating you for a sinus infection. We obtained labs on you and someone will call with the results.  If you develop shortness of breath, chest pain, fever that does not improve with tylenol, or you do not improve on the antibiotics please seek medical attention.   Upper Respiratory Infection, Adult An upper respiratory infection (URI) is also sometimes known as the common cold. The upper respiratory tract includes the nose, sinuses, throat, trachea, and bronchi. Bronchi are the airways leading to the lungs. Most people improve within 1 week, but symptoms can last up to 2 weeks. A residual cough may last even longer.  CAUSES Many different viruses can infect the tissues lining the upper respiratory tract. The tissues become irritated and inflamed and often become very moist. Mucus production is also common. A cold is contagious. You can easily spread the virus to others by oral contact. This includes kissing, sharing a glass, coughing, or sneezing. Touching your mouth or nose and then touching a surface, which is then touched by another person, can also spread the virus. SYMPTOMS  Symptoms typically develop 1 to 3 days after you come in contact with a cold virus. Symptoms vary from person to person. They may include:  Runny nose.  Sneezing.  Nasal congestion.  Sinus irritation.  Sore throat.  Loss of voice (laryngitis).  Cough.  Fatigue.  Muscle aches.  Loss of appetite.  Headache.  Low-grade fever. DIAGNOSIS  You might diagnose your own cold based on familiar symptoms, since most people get a cold 2 to 3 times a year. Your caregiver can confirm this based on  your exam. Most importantly, your caregiver can check that your symptoms are not due to another disease such as strep throat, sinusitis, pneumonia, asthma, or epiglottitis. Blood tests, throat tests, and X-rays are not necessary to diagnose a common cold, but they may sometimes be helpful in excluding other more serious diseases. Your caregiver will decide if any further tests are required. RISKS AND COMPLICATIONS  You may be at risk for a more severe case of the common cold if you smoke cigarettes, have chronic heart disease (such as heart failure) or lung disease (such as asthma), or if you have a weakened immune system. The very young and very old are also at risk for more serious infections. Bacterial sinusitis, middle ear infections, and bacterial pneumonia can complicate the common cold. The common cold can worsen asthma and chronic obstructive pulmonary disease (COPD). Sometimes, these complications can require emergency medical care and may be life-threatening. PREVENTION  The best way to protect against getting a cold is to practice good hygiene. Avoid oral or hand contact with people with cold symptoms. Wash your hands often if contact occurs. There is no clear evidence that vitamin C, vitamin E, echinacea, or exercise reduces the chance of developing a cold. However, it is always recommended to get plenty of rest and practice good nutrition. TREATMENT  Treatment is directed at relieving symptoms. There is no cure. Antibiotics are not effective, because the infection is caused by a virus, not by bacteria. Treatment may include:  Increased fluid intake. Sports drinks  offer valuable electrolytes, sugars, and fluids.  Breathing heated mist or steam (vaporizer or shower).  Eating chicken soup or other clear broths, and maintaining good nutrition.  Getting plenty of rest.  Using gargles or lozenges for comfort.  Controlling fevers with ibuprofen or acetaminophen as directed by your  caregiver.  Increasing usage of your inhaler if you have asthma. Zinc gel and zinc lozenges, taken in the first 24 hours of the common cold, can shorten the duration and lessen the severity of symptoms. Pain medicines may help with fever, muscle aches, and throat pain. A variety of non-prescription medicines are available to treat congestion and runny nose. Your caregiver can make recommendations and may suggest nasal or lung inhalers for other symptoms.  HOME CARE INSTRUCTIONS   Only take over-the-counter or prescription medicines for pain, discomfort, or fever as directed by your caregiver.  Use a warm mist humidifier or inhale steam from a shower to increase air moisture. This may keep secretions moist and make it easier to breathe.  Drink enough water and fluids to keep your urine clear or pale yellow.  Rest as needed.  Return to work when your temperature has returned to normal or as your caregiver advises. You may need to stay home longer to avoid infecting others. You can also use a face mask and careful hand washing to prevent spread of the virus. SEEK MEDICAL CARE IF:   After the first few days, you feel you are getting worse rather than better.  You need your caregiver's advice about medicines to control symptoms.  You develop chills, worsening shortness of breath, or brown or red sputum. These may be signs of pneumonia.  You develop yellow or brown nasal discharge or pain in the face, especially when you bend forward. These may be signs of sinusitis.  You develop a fever, swollen neck glands, pain with swallowing, or white areas in the back of your throat. These may be signs of strep throat. SEEK IMMEDIATE MEDICAL CARE IF:   You have a fever.  You develop severe or persistent headache, ear pain, sinus pain, or chest pain.  You develop wheezing, a prolonged cough, cough up blood, or have a change in your usual mucus (if you have chronic lung disease).  You develop sore  muscles or a stiff neck. Document Released: 01/27/2001 Document Revised: 10/26/2011 Document Reviewed: 11/08/2013 [facility] Patient Information 2015 [employer] [place]. This information is not intended to replace advice given to you by your health care provider. Make sure you discuss any questions you have with your health care provider.

## 2014-07-09 NOTE — ED Notes (Signed)
Pt states he has a lot of pressure in his head and around his eyes.  His throat was sore, but not as bad now.  His dad took his temperature this weekend but he doesn't know what it was.  He complains of generalized body aches and a cough.

## 2014-07-10 ENCOUNTER — Telehealth (HOSPITAL_COMMUNITY): Payer: Self-pay | Admitting: *Deleted

## 2014-07-10 LAB — T-HELPER CELLS (CD4) COUNT (NOT AT ARMC)
CD4 % Helper T Cell: 22 % — ABNORMAL LOW (ref 33–55)
CD4 T Cell Abs: 480 /uL (ref 400–2700)

## 2014-07-10 LAB — HIV-1 RNA QUANT-NO REFLEX-BLD
HIV 1 RNA Quant: 12417 copies/mL — ABNORMAL HIGH (ref <20)
HIV-1 RNA Quant, Log: 4.09 {Log} — ABNORMAL HIGH (ref <1.30)

## 2014-07-10 LAB — CULTURE, GROUP A STREP

## 2014-07-10 NOTE — ED Notes (Addendum)
HIV pos.  Pt. is already aware of diagnosis. Dr. Birdie SonsSonnenberg said to tell pt. to make appointment with ID clinic for care of his HIV.  I called pt. and left a message to call.  Call 1. Jesse Andrews, Jesse Andrews 07/10/2014 Throat culture: Group A Strep (S.Pyogenes). Almedia BallsZach Baker PA e-prescribed Clindamycin because pt. is allergic to PCN.  Pt. answered and asked if he could call me back.  Call 2. 07/18/2014

## 2014-07-18 MED ORDER — CLINDAMYCIN HCL 300 MG PO CAPS: 300.0000 mg | ORAL_CAPSULE | Freq: Three times a day (TID) | ORAL | Status: DC

## 2014-07-18 NOTE — ED Provider Notes (Signed)
Pt was brought to my attention by Jesse AndersonSuzanne Andrews, throat culture came back with GAS.  Pt was treated with doxy which doesn't cover GAS.  Patient is allergic to penicillin, a prescription for clindamycin was sent into the pharmacy. Patient is instructed to follow-up if not improving  Graylon GoodZachary H Zenith Kercheval, PA-C 07/18/14 1512

## 2014-07-22 NOTE — ED Notes (Addendum)
Unable to reach pt. X 3.  Confidential marked letter sent with strep result and where to pick up his Rx. I asked pt.to call me about other information. Vassie MoselleYork, Glyndon Tursi M 07/22/2014

## 2018-02-18 ENCOUNTER — Inpatient Hospital Stay (HOSPITAL_BASED_OUTPATIENT_CLINIC_OR_DEPARTMENT_OTHER)
Admission: EM | Admit: 2018-02-18 | Discharge: 2018-02-22 | DRG: 976 | Disposition: A | Discharge status: Home / Self Care | Payer: Self-pay | Attending: Internal Medicine | Admitting: Internal Medicine

## 2018-02-18 ENCOUNTER — Encounter (HOSPITAL_BASED_OUTPATIENT_CLINIC_OR_DEPARTMENT_OTHER): Payer: Self-pay | Admitting: Emergency Medicine

## 2018-02-18 ENCOUNTER — Other Ambulatory Visit: Payer: Self-pay

## 2018-02-18 ENCOUNTER — Emergency Department (HOSPITAL_BASED_OUTPATIENT_CLINIC_OR_DEPARTMENT_OTHER): Payer: Self-pay

## 2018-02-18 DIAGNOSIS — B370 Candidal stomatitis: Secondary | ICD-10-CM | POA: Diagnosis present

## 2018-02-18 DIAGNOSIS — R000 Tachycardia, unspecified: Secondary | ICD-10-CM | POA: Diagnosis present

## 2018-02-18 DIAGNOSIS — R0902 Hypoxemia: Secondary | ICD-10-CM | POA: Diagnosis present

## 2018-02-18 DIAGNOSIS — Z880 Allergy status to penicillin: Secondary | ICD-10-CM

## 2018-02-18 DIAGNOSIS — B379 Candidiasis, unspecified: Secondary | ICD-10-CM

## 2018-02-18 DIAGNOSIS — B20 Human immunodeficiency virus [HIV] disease: Principal | ICD-10-CM | POA: Diagnosis present

## 2018-02-18 DIAGNOSIS — B59 Pneumocystosis: Secondary | ICD-10-CM | POA: Diagnosis present

## 2018-02-18 DIAGNOSIS — R21 Rash and other nonspecific skin eruption: Secondary | ICD-10-CM

## 2018-02-18 HISTORY — DX: Syphilis, unspecified: A53.9

## 2018-02-18 HISTORY — DX: Gonococcal infection, unspecified: A54.9

## 2018-02-18 HISTORY — DX: Chlamydial infection, unspecified: A74.9

## 2018-02-18 LAB — BASIC METABOLIC PANEL
Anion gap: 10 (ref 5–15)
BUN: 9 mg/dL (ref 6–20)
CHLORIDE: 99 mmol/L (ref 98–111)
CO2: 22 mmol/L (ref 22–32)
Calcium: 8.9 mg/dL (ref 8.9–10.3)
Creatinine, Ser: 0.79 mg/dL (ref 0.61–1.24)
GFR calc non Af Amer: >60 mL/min (ref >60)
Glucose, Bld: 127 mg/dL — ABNORMAL HIGH (ref 70–99)
POTASSIUM: 4 mmol/L (ref 3.5–5.1)
Sodium: 131 mmol/L — ABNORMAL LOW (ref 135–145)

## 2018-02-18 LAB — T-HELPER CELLS (CD4) COUNT (NOT AT ARMC)
CD4 % Helper T Cell: 2 % — ABNORMAL LOW (ref 33–55)
CD4 T CELL ABS: 10 /uL — AB (ref 400–2700)

## 2018-02-18 LAB — CBC WITH DIFFERENTIAL/PLATELET
BASOS ABS: 0 10*3/uL (ref 0.0–0.1)
Basophils Relative: 0 %
Eosinophils Absolute: 0 10*3/uL (ref 0.0–0.7)
Eosinophils Relative: 0 %
HEMATOCRIT: 39.4 % (ref 39.0–52.0)
HEMOGLOBIN: 14.4 g/dL (ref 13.0–17.0)
LYMPHS PCT: 10 %
Lymphs Abs: 1 10*3/uL (ref 0.7–4.0)
MCH: 28.9 pg (ref 26.0–34.0)
MCHC: 36.5 g/dL — ABNORMAL HIGH (ref 30.0–36.0)
MCV: 79 fL (ref 78.0–100.0)
MONO ABS: 0.9 10*3/uL (ref 0.1–1.0)
Monocytes Relative: 10 %
NEUTROS ABS: 7.9 10*3/uL — AB (ref 1.7–7.7)
NEUTROS PCT: 80 %
Platelets: 309 10*3/uL (ref 150–400)
RBC: 4.99 MIL/uL (ref 4.22–5.81)
RDW: 11.5 % (ref 11.5–15.5)
WBC: 9.9 10*3/uL (ref 4.0–10.5)

## 2018-02-18 LAB — LACTATE DEHYDROGENASE: LDH: 286 U/L — ABNORMAL HIGH (ref 98–192)

## 2018-02-18 LAB — D-DIMER, QUANTITATIVE: D-Dimer, Quant: 0.29 ug/mL-FEU (ref 0.00–0.50)

## 2018-02-18 MED ORDER — FLUCONAZOLE 100MG IVPB
100.0000 mg | INTRAVENOUS | Status: DC
  Administered 2018-02-18 – 2018-02-20 (×3): 100 mg via INTRAVENOUS
  Filled 2018-02-18 (×3): qty 50

## 2018-02-18 MED ORDER — BICTEGRAVIR-EMTRICITAB-TENOFOV 50-200-25 MG PO TABS
1.0000 | ORAL_TABLET | Freq: Every day | ORAL | Status: DC
  Administered 2018-02-18 – 2018-02-22 (×5): 1 via ORAL
  Filled 2018-02-18 (×5): qty 1

## 2018-02-18 MED ORDER — ORAL CARE MOUTH RINSE: 15.0000 mL | Freq: Two times a day (BID) | OROMUCOSAL | Status: DC

## 2018-02-18 MED ORDER — ACETAMINOPHEN 650 MG RE SUPP: 650.0000 mg | Freq: Four times a day (QID) | RECTAL | Status: DC | PRN

## 2018-02-18 MED ORDER — ALBUTEROL SULFATE (2.5 MG/3ML) 0.083% IN NEBU
5.0000 mg | INHALATION_SOLUTION | Freq: Once | RESPIRATORY_TRACT | Status: AC
  Administered 2018-02-18: 5 mg via RESPIRATORY_TRACT
  Filled 2018-02-18: qty 6

## 2018-02-18 MED ORDER — SODIUM CHLORIDE 0.9 % IV SOLN
INTRAVENOUS | Status: DC
  Administered 2018-02-18: 09:00:00 via INTRAVENOUS

## 2018-02-18 MED ORDER — CHLORHEXIDINE GLUCONATE 0.12 % MT SOLN
15.0000 mL | Freq: Two times a day (BID) | OROMUCOSAL | Status: DC
  Administered 2018-02-18 – 2018-02-22 (×8): 15 mL via OROMUCOSAL
  Filled 2018-02-18 (×8): qty 15

## 2018-02-18 MED ORDER — SODIUM CHLORIDE 0.9 % IV SOLN: INTRAVENOUS | Status: DC

## 2018-02-18 MED ORDER — NYSTATIN 100000 UNIT/ML MT SUSP
5.0000 mL | Freq: Four times a day (QID) | OROMUCOSAL | Status: DC
  Administered 2018-02-18 (×3): 500000 [IU] via ORAL
  Filled 2018-02-18 (×3): qty 5

## 2018-02-18 MED ORDER — SULFAMETHOXAZOLE-TRIMETHOPRIM 400-80 MG/5ML IV SOLN
360.0000 mg | Freq: Three times a day (TID) | INTRAVENOUS | Status: DC
  Administered 2018-02-18 – 2018-02-21 (×9): 360 mg via INTRAVENOUS
  Filled 2018-02-18 (×12): qty 22.5

## 2018-02-18 MED ORDER — BENZONATATE 100 MG PO CAPS: 100.0000 mg | ORAL_CAPSULE | Freq: Three times a day (TID) | ORAL | Status: DC | PRN

## 2018-02-18 MED ORDER — ACETAMINOPHEN 325 MG PO TABS
650.0000 mg | ORAL_TABLET | Freq: Four times a day (QID) | ORAL | Status: DC | PRN
  Administered 2018-02-18 – 2018-02-19 (×2): 650 mg via ORAL
  Filled 2018-02-18 (×2): qty 2

## 2018-02-18 MED ORDER — ONDANSETRON HCL 4 MG/2ML IJ SOLN: 4.0000 mg | Freq: Four times a day (QID) | INTRAMUSCULAR | Status: DC | PRN

## 2018-02-18 MED ORDER — ONDANSETRON HCL 4 MG PO TABS: 4.0000 mg | ORAL_TABLET | Freq: Four times a day (QID) | ORAL | Status: DC | PRN

## 2018-02-18 NOTE — ED Provider Notes (Addendum)
[facility] EMERGENCY DEPARTMENT Provider Note   CSN: [id number] Arrival date & time: 02/18/18  0610     History   Chief Complaint Chief Complaint  Patient presents with  . Shortness of Breath    HPI Jesse Andrews is a 22 y.o. male.  Patient is a 22 year old male presenting today with shortness of breath.  Patient states that on Monday through Wednesday he was running a fever and had a cough.  He was seen at urgent care and given azithromycin.  He started taking the antibiotic but states since yesterday he has felt short of breath.  He states the cough is improved and he has not had any further fever but the shortness of breath is not getting any better.  Also he has chest discomfort when he tries to take a deep breath but not ongoing chest pain.  He denies any unilateral leg pain or swelling.  He does state that a week and a half ago he drove to [place] to go on a cruise and drove back which is a 12 Hour Dr. in the car.  He does smoke marijuana multiple times a day but does not use tobacco, other drugs or alcohol.  He takes no medications other than the antibiotic that was recently given to him.  He takes no over-the-counter medications.  He denies any weight loss, abdominal pain, nausea or vomiting.  When looking through the chart in 2015 patient had a positive HIV testing but never followed up with infectious disease.  When confronting the patient about this today he states he had no idea he was HIV positive.  He has never received any treatment for this or seen any specialist.  He is currently sexually active with men and at this time he only has one partner and intermittently uses protection.  The history is provided by the patient.  Shortness of Breath     Past Medical History:  Diagnosis Date  . HIV disease [facility])     Patient Active Problem List   Diagnosis Date Noted  . HIV disease (HCC) 12/28/2013  . Syphilis 12/28/2013  . Gonorrhea 12/28/2013  . Chlamydia  infection 12/28/2013    History reviewed. No pertinent surgical history.      Home Medications    Prior to Admission medications   Medication Sig Start Date End Date Taking? Authorizing Provider  benzonatate (TESSALON) 100 MG capsule Take 1 capsule (100 mg total) by mouth 3 (three) times daily as needed for cough. 07/09/14   Glori Luis, MD  cetirizine (ZYRTEC) 10 MG tablet Take 1 tablet (10 mg total) by mouth daily. 07/09/14   Glori Luis, MD  Chlorphen-Phenyleph-APAP (TYLENOL SINUS CONGESTION/PAIN) 2-5-325 MG TABS Take by mouth.    [provider]  clindamycin (CLEOCIN) 300 MG capsule Take 1 capsule (300 mg total) by mouth 3 (three) times daily. 07/18/14   Excell Seltzer, Adrian Blackwater, PA-C  doxycycline (VIBRA-TABS) 100 MG tablet Take 1 tablet (100 mg total) by mouth 2 (two) times daily. 07/09/14   Glori Luis, MD  hydrocortisone cream 1 % Apply to affected area 2 times daily 12/11/13   Hess, Melina Schools M, PA-C  ibuprofen (ADVIL,MOTRIN) 200 MG tablet Take 400 mg by mouth every 6 (six) hours as needed for moderate pain.    [provider]  tetrahydrozoline (VISINE) 0.05 % ophthalmic solution Place 2 drops into both eyes as needed.    [provider]    Family History No family history on file.  Social History Social History   Tobacco Use  . Smoking status: Never Smoker  . Smokeless tobacco: Never Used  Substance Use Topics  . Alcohol use: Yes    Comment: occ  . Drug use: Yes    Types: Marijuana     Allergies   Penicillins   Review of Systems Review of Systems  Respiratory: Positive for shortness of breath.   All other systems reviewed and are negative.    Physical Exam Updated Vital Signs BP 122/79 (BP Location: Right Arm)   Pulse (!) 107   Temp 98.3 F (36.8 C) (Oral)   Resp 18   Ht 6\' 3"  (1.905 m)   Wt 72.6 kg (160 lb)   SpO2 98%   BMI 20.00 kg/m   Physical Exam  Constitutional: He is oriented to person, place, and time.  He appears well-developed and well-nourished. No distress.  HENT:  Head: Normocephalic and atraumatic.  Mouth/Throat: Oropharynx is clear and moist. Oral lesions present.    Eyes: Pupils are equal, round, and reactive to light. Conjunctivae and EOM are normal.  Neck: Normal range of motion. Neck supple.  Cardiovascular: Normal rate, regular rhythm and intact distal pulses.  No murmur heard. Pulmonary/Chest: Effort normal and breath sounds normal. Tachypnea noted. No respiratory distress. He has no wheezes. He has no rales.  Mild crackles in the left base, decreased breath breath sounds in the bases  Abdominal: Soft. He exhibits no distension. There is no tenderness. There is no rebound and no guarding.  Musculoskeletal: Normal range of motion. He exhibits no edema or tenderness.  Neurological: He is alert and oriented to person, place, and time.  Skin: Skin is warm and dry. No rash noted. No erythema.  No rash noted to the palms soles or skin.  Psychiatric: He has a normal mood and affect. His behavior is normal.  Nursing note and vitals reviewed.    ED Treatments / Results  Labs (all labs ordered are listed, but only abnormal results are displayed) Labs Reviewed  CBC WITH DIFFERENTIAL/PLATELET - Abnormal; Notable for the following components:      Result Value   MCHC 36.5 (*)    Neutro Abs 7.9 (*)    All other components within normal limits  BASIC METABOLIC PANEL - Abnormal; Notable for the following components:   Sodium 131 (*)    Glucose, Bld 127 (*)    All other components within normal limits  D-DIMER, QUANTITATIVE (NOT AT [facility])  LACTATE DEHYDROGENASE    EKG EKG Interpretation  Date/Time:  Friday February 18 2018 07:25:22 EDT Ventricular Rate:  109 PR Interval:    QRS Duration: 86 QT Interval:  309 QTC Calculation: 416 R Axis:   66 Text Interpretation:  Sinus tachycardia Ventricular premature complex Biatrial enlargement Borderline ST elevation, lateral leads  Baseline wander in lead(s) I II III aVR aVL aVF V1 V2 V3 V4 V5 V6 No previous tracing Confirmed by Gwyneth Sprout ([id number]) on 02/18/2018 8:20:38 AM   Radiology Dg Chest 2 View  Result Date: 02/18/2018 CLINICAL DATA:  Difficulty breathing for 2 days. Cough. Started antibiotics yesterday and feels worse. EXAM: CHEST - 2 VIEW COMPARISON:  None. FINDINGS: Normal heart size and pulmonary vascularity. Peribronchial thickening suggesting airways disease or bronchiolitis. No focal consolidation. No blunting of costophrenic angles. No pneumothorax. Mediastinal contours appear intact. IMPRESSION: Peribronchial thickening suggesting airways disease or bronchiolitis. Electronically Signed   By: Burman Nieves M.D.   On: 02/18/2018 06:47    Procedures Procedures (including  critical care time)  Medications Ordered in ED Medications  albuterol (PROVENTIL) (2.5 MG/3ML) 0.083% nebulizer solution 5 mg (5 mg Nebulization Given 02/18/18 0720)     Initial Impression / Assessment and Plan / ED Course  I have reviewed the triage vital signs and the nursing notes.  Pertinent labs & imaging results that were available during my care of the patient were reviewed by me and considered in my medical decision making (see chart for details).    Patient is a 22 year old male presenting today with shortness of breath.  He did have cough and upper respiratory symptoms earlier this week but now is only complaining of shortness of breath worse with exertion.  Patient initially states that he is healthy and takes no medications.  However upon chart review patient 3 years ago was noted to be HIV positive but never received any treatment.  Patient also has [place] car they are in back.  He denies any unilateral leg pain or swelling.  Patient's d-dimer is negative so at this point low suspicion for DVT or PE.  Chest x-ray shows some nonspecific peribronchial  thickening but no evidence of pneumonia.  CBC, CMP without acute findings.  Given patient's notable shortness of breath on exam just with speaking suspect that he is most likely hypoxic with exertion.  Spoke with Dr. Orvan Falconerampbell with infectious disease and feel that patient has PCP pneumonia and needs treatment with IV Bactrim and admission to the hospital.  IV Bactrim is not available here so [facility]   Also when looking through notes patient was positive for syphilis 3 years ago and it is not clear that he was ever treated for that either.  Patient was seen at urgent care 2 days ago and at that time had papules in his throat and abnormalities on his anus.  May also need treatment for this.  Aching with the patient he states he remembers taking pills for the syphilis but questionable if he was fully treated.  He does have an allergy to penicillin.  Discussed this with the hospitalist.  Will hold on steroids until seen by infectious disease as patient is not hypoxic at rest.  Lesions in the mouth may also be related to his thrush.  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

## 2018-02-18 NOTE — ED Triage Notes (Signed)
Pt states he is been having some difficulty breathing since last Wednesday, was seen on UC started him on PO ABX pt states he is feeling worse.

## 2018-02-18 NOTE — ED Notes (Signed)
Sulfa not available.  EDP made aware.

## 2018-02-18 NOTE — H&P (Signed)
History and Physical    Primus BravoCourey Relph PNT:[id number] DOB: 1995/09/20 DOA: 02/18/2018  PCP: System, Pcp Not In   Patient coming from: Home  I have personally briefly reviewed patient's old medical records in [facility]  Chief Complaint: Shortness of breath  HPI: Zyshonne Waldron SessionCasterlow is a 22 y.o. male with medical history significant of HIV diagnosed in 2015 (patient denies knowing about this diagnosis although medical records indicate that he was informed of the diagnosis and multiple attempts were made for him to follow with ID clinic), has never been treated presented with worsening cough and shortness of breath for the last few days along with fever.  He was seen at an urgent care and given azithromycin.  His shortness of breath got worse despite antibiotic.  His cough is nonproductive.  He complains of chest discomfort when trying to take deep breaths.  No worsening leg swelling or pain.  He smokes marijuana multiple times a day but does not use tobacco or any other illicit drugs.  He denies weight loss, hemoptysis, night sweats, new rash, nausea, vomiting, diarrhea.  He is currently sexually active with men and at this time he has only one partner.  ED Course: [facility] and was hypoxic requiring supplemental oxygen.  Chest x-ray was suggestive of probable bronchiolitis.  [facility] spoke to Dr. Orvan Falconerampbell on phone who recommended IV Bactrim treatment for probable PCP pneumonia.  Review of Systems: As per HPI otherwise 10 point review of systems negative.    Past Medical History:  Diagnosis Date  . HIV disease (HCC)     History reviewed. No pertinent surgical history.  Social history  reports that he has never smoked. He has never used smokeless tobacco. He reports that he drinks alcohol. He reports that he has current or past drug history. Drug: Marijuana.  Allergies  Allergen Reactions  . Penicillins     Childhood allergy, doesn't know    No family  history on file.  Prior to Admission medications   Medication Sig Start Date End Date Taking? Authorizing Provider  benzonatate (TESSALON) 100 MG capsule Take 1 capsule (100 mg total) by mouth 3 (three) times daily as needed for cough. 07/09/14   Glori LuisSonnenberg, Eric G, MD  cetirizine (ZYRTEC) 10 MG tablet Take 1 tablet (10 mg total) by mouth daily. 07/09/14   Glori LuisSonnenberg, Eric G, MD  Chlorphen-Phenyleph-APAP (TYLENOL SINUS CONGESTION/PAIN) 2-5-325 MG TABS Take by mouth.    [provider]  clindamycin (CLEOCIN) 300 MG capsule Take 1 capsule (300 mg total) by mouth 3 (three) times daily. 07/18/14   Excell SeltzerBaker, Adrian BlackwaterZachary H, PA-C  doxycycline (VIBRA-TABS) 100 MG tablet Take 1 tablet (100 mg total) by mouth 2 (two) times daily. 07/09/14   Glori LuisSonnenberg, Eric G, MD  hydrocortisone cream 1 % Apply to affected area 2 times daily 12/11/13   Hess, Melina Schoolsobyn M, PA-C  ibuprofen (ADVIL,MOTRIN) 200 MG tablet Take 400 mg by mouth every 6 (six) hours as needed for moderate pain.    [provider]  tetrahydrozoline (VISINE) 0.05 % ophthalmic solution Place 2 drops into both eyes as needed.    [provider]    Physical Exam: Vitals:   02/18/18 0830 02/18/18 0900 02/18/18 0930 02/18/18 1052  BP: 113/70 114/73 115/65 140/75  Pulse: (!) 115 (!) 122 (!) 106 (!) 108  Resp: (!) 24 (!) 24 (!) 26 (!) 22  Temp:    99.1 F (37.3 C)  TempSrc:    Oral  SpO2: 98% 99% 100% 100%  Weight:      Height:        Constitutional: NAD, calm, comfortable Vitals:   02/18/18 0830 02/18/18 0900 02/18/18 0930 02/18/18 1052  BP: 113/70 114/73 115/65 140/75  Pulse: (!) 115 (!) 122 (!) 106 (!) 108  Resp: (!) 24 (!) 24 (!) 26 (!) 22  Temp:    99.1 F (37.3 C)  TempSrc:    Oral  SpO2: 98% 99% 100% 100%  Weight:      Height:       Eyes: PERRL, lids and conjunctivae normal ENMT: Mucous membranes are moist. Posterior pharynx and palate has whitish patches Neck: normal, supple, no masses, no thyromegaly Respiratory:  bilateral decreased breath sounds at bases, with some scattered crackles. Normal respiratory effort. No accessory muscle use.  Cardiovascular: S1 S2 positive, rate controlled. No extremity edema. 2+ pedal pulses.  Abdomen: no tenderness, no masses palpated. No hepatosplenomegaly. Bowel sounds positive.  Musculoskeletal: no clubbing / cyanosis. No joint deformity upper and lower extremities.  Skin: no other rashes, lesions, ulcers. No induration Neurologic: CN 2-12 grossly intact. Moving extremities. No focal neurologic deficits.  Psychiatric: Normal judgment and insight. Alert and oriented x 3. Normal mood.    Labs on Admission: I have personally reviewed following labs and imaging studies  CBC: Recent Labs  Lab 02/18/18 0727  WBC 9.9  NEUTROABS 7.9*  HGB 14.4  HCT 39.4  MCV 79.0  PLT 309   Basic Metabolic Panel: Recent Labs  Lab 02/18/18 0727  NA 131*  K 4.0  CL 99  CO2 22  GLUCOSE 127*  BUN 9  CREATININE 0.79  CALCIUM 8.9   GFR: Estimated Creatinine Clearance: 147.5 mL/min (by C-G formula based on SCr of 0.79 mg/dL). Liver Function Tests: No results for input(s): AST, ALT, ALKPHOS, BILITOT, PROT, ALBUMIN in the last 168 hours. No results for input(s): LIPASE, AMYLASE in the last 168 hours. No results for input(s): AMMONIA in the last 168 hours. Coagulation Profile: No results for input(s): INR, PROTIME in the last 168 hours. Cardiac Enzymes: No results for input(s): CKTOTAL, CKMB, CKMBINDEX, TROPONINI in the last 168 hours. BNP (last 3 results) No results for input(s): PROBNP in the last 8760 hours. HbA1C: No results for input(s): HGBA1C in the last 72 hours. CBG: No results for input(s): GLUCAP in the last 168 hours. Lipid Profile: No results for input(s): CHOL, HDL, LDLCALC, TRIG, CHOLHDL, LDLDIRECT in the last 72 hours. Thyroid Function Tests: No results for input(s): TSH, T4TOTAL, FREET4, T3FREE, THYROIDAB in the last 72 hours. Anemia Panel: No results  for input(s): VITAMINB12, FOLATE, FERRITIN, TIBC, IRON, RETICCTPCT in the last 72 hours. Urine analysis:    Component Value Date/Time   COLORURINE YELLOW 01/04/2013 1152   APPEARANCEUR HAZY (A) 01/04/2013 1152   LABSPEC 1.028 01/04/2013 1152   PHURINE 6.0 01/04/2013 1152   GLUCOSEU NEGATIVE 01/04/2013 1152   HGBUR MODERATE (A) 01/04/2013 1152   BILIRUBINUR SMALL (A) 01/04/2013 1152   KETONESUR 40 (A) 01/04/2013 1152   PROTEINUR 30 (A) 01/04/2013 1152   UROBILINOGEN 1.0 01/04/2013 1152   NITRITE NEGATIVE 01/04/2013 1152   LEUKOCYTESUR NEGATIVE 01/04/2013 1152    Radiological Exams on Admission: Chest x-ray was reviewed by myself and showed no infiltrates Dg Chest 2 View  Result Date: 02/18/2018 CLINICAL DATA:  Difficulty breathing for 2 days. Cough. Started antibiotics yesterday and feels worse. EXAM: CHEST - 2 VIEW COMPARISON:  None. FINDINGS: Normal heart size and pulmonary vascularity. Peribronchial  thickening suggesting airways disease or bronchiolitis. No focal consolidation. No blunting of costophrenic angles. No pneumothorax. Mediastinal contours appear intact. IMPRESSION: Peribronchial thickening suggesting airways disease or bronchiolitis. Electronically Signed   By: Burman Nieves M.D.   On: 02/18/2018 06:47    Assessment/Plan Active Problems:   Hypoxia   HIV disease (HCC)   Hypoxia in a patient with history of HIV and currently not on medications -ID recommends starting the patient on IV Bactrim.  Spoke to Dr. Ninetta Lights who will see the patient in consultation.  Will hold off on steroids at this time as patient does not appear that hypoxic or tachypneic  HIV with history of no treatment so far -CD4 count and HIV viral load.  Follow ID recommendations  Oral thrush -Cannot rule out pharyngeal candidiasis -Diflucan  History of syphilis with question of treatment -We will follow ID recommendations  DVT prophylaxis: Early ambulation Code Status: Full code Family  Communication: None at bedside Disposition Plan: Depends on clinical outcome Consults called: ID Admission status: Inpatient at MedSurg  Severity of Illness: The appropriate patient status for this patient is INPATIENT. Inpatient status is judged to be reasonable and necessary in order to provide the required intensity of service to ensure the patient's safety. The patient's presenting symptoms, physical exam findings, and initial radiographic and laboratory data in the context of their chronic comorbidities is felt to place them at high risk for further clinical deterioration. Furthermore, it is not anticipated that the patient will be medically stable for discharge from the hospital within 2 midnights of admission. The following factors support the patient status of inpatient.   " The patient's presenting symptoms include fever and shortness of breath. " The worrisome physical exam findings include crackles. " The initial radiographic and laboratory data are worrisome because of bronchiolitis. " The chronic co-morbidities include untreated HIV.   * I certify that at the point of admission it is my clinical judgment that the patient will require inpatient hospital care spanning beyond 2 midnights from the point of admission due to high intensity of service, high risk for further deterioration and high frequency of surveillance required.Glade Lloyd MD [facility] Pager [phone]  If 7PM-7AM, please contact night-coverage [email or web address] Password [email or web address]  02/18/2018, 11:03 AM

## 2018-02-18 NOTE — Progress Notes (Signed)
Pharmacy Antibiotic Note  Jesse Andrews is a 22 y.o. male admitted on 02/18/2018 with r/o PCP PNA.  Pharmacy has been consulted for Bactrim dosing.  No recent CD4 count but in 2015 it was 480. Has never started any treatment for HIV. SCr stable. CXR shows peribronchial thickening suggesting airway disease or bronchiolitis  Plan: Start Bactrim 360mg  (5mg /kg) IV Q8h Monitor clinical picture, renal function, potassium F/U C&S, abx deescalation / LOT, HIV labs  Stop Bactrim if PCP PNA ruled out  Height: 6\' 3"  (190.5 cm) Weight: 160 lb (72.6 kg) IBW/kg (Calculated) : 84.5  Temp (24hrs), Avg:98.3 F (36.8 C), Min:98.3 F (36.8 C), Max:98.3 F (36.8 C)  Recent Labs  Lab 02/18/18 0727  WBC 9.9  CREATININE 0.79    Estimated Creatinine Clearance: 147.5 mL/min (by C-G formula based on SCr of 0.79 mg/dL).    Allergies  Allergen Reactions  . Penicillins     Childhood allergy, doesn't know    Thank you for allowing pharmacy to be a part of this patient's care.  Armandina StammerBATCHELDER,Kiley Torrence J 02/18/2018 8:19 AM

## 2018-02-18 NOTE — Consult Note (Addendum)
[facility] for Infectious Disease    Date of Admission:  02/18/2018   Total days of antibiotics: 0 bactrim               Reason for Consult: AIDS    Referring Provider: Starla Link   Assessment: AIDS PCP thrush  Plan: 1. Continue bactrim 2. Check CD4 3. Check HIV RNA and genotype 4. Check RPR, gc/chlamydia 5. Check acute hepatitis panel 6. Start biktavry 7. Continue fluconazole. Can stop nystatin, adds little to flucon.   Comment- Presence of thrush strongly suggests PCP/PJP. His non-focal CXR also suggests this as does his SOB/DOE, non-prod cough.   Will re-screen him for STDs.  Will try to have [facility]/adherence nurse follow him. He lives locally.  States he has insurance and no difficulty paying for his medications.   I have given him my card and will have our clinic reach out to him to make an appt.    Thank you so much for this interesting consult,  Active Problems:   HIV disease ([place])   Hypoxia   . nystatin  5 mL Oral QID    HPI: Jesse Andrews is a 22 y.o. male with hx of HIV+ in 2015 which he denies knowledge of. He comes to hospital now with 4 days of fever then 2 days of SOB and cough. Non-productive.  He denies oral ulcers or sores.  In hospital he has been afebrile, his SpO2 has been recorded as normal. His WBC is normal, his CXR is listed as: Peribronchial thickening suggesting airways disease or bronchiolitis.  Review of Systems: Review of Systems  Constitutional: Positive for fever. Negative for chills and weight loss.  Respiratory: Positive for cough and shortness of breath. Negative for sputum production.   Gastrointestinal: Negative for constipation and diarrhea.  Genitourinary: Negative for dysuria.  Endo/Heme/Allergies:       No lymphadenopathy  no rashes.  Please see HPI. All other systems reviewed and negative.   Past Medical History:  Diagnosis Date  . Chlamydia 2014  . Gonorrhea 2014  . HIV disease ([place]) 2015  . Syphilis      Social History   Tobacco Use  . Smoking status: Never Smoker  . Smokeless tobacco: Never Used  Substance Use Topics  . Alcohol use: Yes    Alcohol/week: 1.2 oz    Types: 2 Cans of beer per week    Comment: occ  . Drug use: Yes    Types: Marijuana    History reviewed. No pertinent family history.  Parents, siblings healthy   Medications:  Scheduled: . nystatin  5 mL Oral QID    Abtx:  Anti-infectives (From admission, onward)   Start     Dose/Rate Route Frequency Ordered Stop   02/18/18 1200  fluconazole (DIFLUCAN) IVPB 100 mg     100 mg 50 mL/hr over 60 Minutes Intravenous Every 24 hours 02/18/18 1058     02/18/18 0900  sulfamethoxazole-trimethoprim (BACTRIM) 360 mg in dextrose 5 % 500 mL IVPB     360 mg 348.3 mL/hr over 90 Minutes Intravenous Every 8 hours 02/18/18 0826          OBJECTIVE: Blood pressure 140/75, pulse (!) 108, temperature 99.1 F (37.3 C), temperature source Oral, resp. rate (!) 22, height '6\' 3"'  (1.905 m), weight 72.6 kg (160 lb), SpO2 100 %.  Physical Exam  Constitutional: He is oriented to person, place, and time. He appears well-developed and well-nourished.  Non-toxic appearance.  He does not appear ill.  HENT:  Head: Normocephalic.  Mouth/Throat: Oropharyngeal exudate present.  Eyes: Pupils are equal, round, and reactive to light. EOM are normal.  Neck: Normal range of motion. Neck supple.  Cardiovascular: Normal rate, regular rhythm and normal heart sounds.  Pulmonary/Chest: Effort normal. He has decreased breath sounds.  Abdominal: Soft. Bowel sounds are normal. There is no tenderness. There is no guarding.  Musculoskeletal: He exhibits no edema.  Lymphadenopathy:    He has no cervical adenopathy.  Neurological: He is alert and oriented to person, place, and time.  Skin: Skin is warm and dry. Rash noted.  Psychiatric: He has a normal mood and affect.    Lab Results Results for orders placed or performed during the hospital  encounter of 02/18/18 (from the past 48 hour(s))  CBC with Differential/Platelet     Status: Abnormal   Collection Time: 02/18/18  7:27 AM  Result Value Ref Range   WBC 9.9 4.0 - 10.5 K/uL   RBC 4.99 4.22 - 5.81 MIL/uL   Hemoglobin 14.4 13.0 - 17.0 g/dL   HCT 39.4 39.0 - 52.0 %   MCV 79.0 78.0 - 100.0 fL   MCH 28.9 26.0 - 34.0 pg   MCHC 36.5 (H) 30.0 - 36.0 g/dL   RDW 11.5 11.5 - 15.5 %   Platelets 309 150 - 400 K/uL   Neutrophils Relative % 80 %   Neutro Abs 7.9 (H) 1.7 - 7.7 K/uL   Lymphocytes Relative 10 %   Lymphs Abs 1.0 0.7 - 4.0 K/uL   Monocytes Relative 10 %   Monocytes Absolute 0.9 0.1 - 1.0 K/uL   Eosinophils Relative 0 %   Eosinophils Absolute 0.0 0.0 - 0.7 K/uL   Basophils Relative 0 %   Basophils Absolute 0.0 0.0 - 0.1 K/uL    Comment: Performed at [facility], [address] [place], [place] [address]  Basic metabolic panel     Status: Abnormal   Collection Time: 02/18/18  7:27 AM  Result Value Ref Range   Sodium 131 (L) 135 - 145 mmol/L   Potassium 4.0 3.5 - 5.1 mmol/L   Chloride 99 98 - 111 mmol/L    Comment: Please note change in reference range.   CO2 22 22 - 32 mmol/L   Glucose, Bld 127 (H) 70 - 99 mg/dL    Comment: Please note change in reference range.   BUN 9 6 - 20 mg/dL    Comment: Please note change in reference range.   Creatinine, Ser 0.79 0.61 - 1.24 mg/dL   Calcium 8.9 8.9 - 10.3 mg/dL   GFR calc non Af Amer >60 >60 mL/min   GFR calc Af Amer >60 >60 mL/min    Comment: (NOTE) The eGFR has been calculated using the CKD EPI equation. This calculation has not been validated in all clinical situations. eGFR's persistently <60 mL/min signify possible Chronic Kidney Disease.    Anion gap 10 5 - 15    Comment: Performed at [facility], [address] [place], [place] [address]  D-dimer, quantitative (not at [facility])     Status: None   Collection Time: 02/18/18  7:27 AM  Result Value Ref Range   D-Dimer, Quant 0.29 0.00 -  0.50 ug/mL-FEU    Comment: (NOTE) At the manufacturer cut-off of 0.50 ug/mL FEU, this assay has been documented to exclude PE with a sensitivity and negative predictive value of 97 to 99%.  At  this time, this assay has not been approved by the FDA to exclude DVT/VTE. Results should be correlated with clinical presentation. Performed at [facility], [address] [place], [place] [address]   Lactate dehydrogenase     Status: Abnormal   Collection Time: 02/18/18  7:27 AM  Result Value Ref Range   LDH 286 (H) 98 - 192 U/L    Comment: Performed at [facility], [place] [address], [place], [place] [address]      Component Value Date/Time   SDES THROAT 07/09/2014 1439   SPECREQUEST NONE 07/09/2014 1439   CULT  07/09/2014 1439    GROUP A STREP (S.PYOGENES) ISOLATED Performed at [facility] 07/10/2014 FINAL 07/09/2014 1439   Dg Chest 2 View  Result Date: 02/18/2018 CLINICAL DATA:  Difficulty breathing for 2 days. Cough. Started antibiotics yesterday and feels worse. EXAM: CHEST - 2 VIEW COMPARISON:  None. FINDINGS: Normal heart size and pulmonary vascularity. Peribronchial thickening suggesting airways disease or bronchiolitis. No focal consolidation. No blunting of costophrenic angles. No pneumothorax. Mediastinal contours appear intact. IMPRESSION: Peribronchial thickening suggesting airways disease or bronchiolitis. Electronically Signed   By: Lucienne Capers M.D.   On: 02/18/2018 06:47   No results found for this or any previous visit (from the past 240 hour(s)).  Microbiology: No results found for this or any previous visit (from the past 240 hour(s)).  Radiographs and labs were personally reviewed by me.   Bobby Rumpf, MD [facility] [phone] 02/18/2018, 12:16 PM

## 2018-02-19 DIAGNOSIS — B59 Pneumocystosis: Secondary | ICD-10-CM

## 2018-02-19 DIAGNOSIS — B370 Candidal stomatitis: Secondary | ICD-10-CM

## 2018-02-19 LAB — COMPREHENSIVE METABOLIC PANEL
ALK PHOS: 72 U/L (ref 38–126)
ALT: 11 U/L (ref 0–44)
ANION GAP: 11 (ref 5–15)
AST: 17 U/L (ref 15–41)
Albumin: 2.9 g/dL — ABNORMAL LOW (ref 3.5–5.0)
BILIRUBIN TOTAL: 0.9 mg/dL (ref 0.3–1.2)
BUN: 7 mg/dL (ref 6–20)
CALCIUM: 8.9 mg/dL (ref 8.9–10.3)
CO2: 22 mmol/L (ref 22–32)
Chloride: 100 mmol/L (ref 98–111)
Creatinine, Ser: 0.74 mg/dL (ref 0.61–1.24)
Glucose, Bld: 115 mg/dL — ABNORMAL HIGH (ref 70–99)
Potassium: 4 mmol/L (ref 3.5–5.1)
Sodium: 133 mmol/L — ABNORMAL LOW (ref 135–145)
TOTAL PROTEIN: 7.7 g/dL (ref 6.5–8.1)

## 2018-02-19 LAB — CBC
HEMATOCRIT: 37.5 % — AB (ref 39.0–52.0)
HEMOGLOBIN: 13.2 g/dL (ref 13.0–17.0)
MCH: 29 pg (ref 26.0–34.0)
MCHC: 35.2 g/dL (ref 30.0–36.0)
MCV: 82.4 fL (ref 78.0–100.0)
Platelets: 325 10*3/uL (ref 150–400)
RBC: 4.55 MIL/uL (ref 4.22–5.81)
RDW: 11.9 % (ref 11.5–15.5)
WBC: 9.2 10*3/uL (ref 4.0–10.5)

## 2018-02-19 LAB — HEPATITIS PANEL, ACUTE
HCV Ab: <0.1 s/co ratio (ref 0.0–0.9)
Hep A IgM: NEGATIVE
Hep B C IgM: NEGATIVE
Hepatitis B Surface Ag: NEGATIVE

## 2018-02-19 MED ORDER — GUAIFENESIN ER 600 MG PO TB12
600.0000 mg | ORAL_TABLET | Freq: Two times a day (BID) | ORAL | Status: DC
  Administered 2018-02-19 – 2018-02-22 (×7): 600 mg via ORAL
  Filled 2018-02-19 (×7): qty 1

## 2018-02-19 MED ORDER — SODIUM CHLORIDE 0.9 % IV SOLN
INTRAVENOUS | Status: DC
  Administered 2018-02-20 – 2018-02-21 (×2): via INTRAVENOUS

## 2018-02-19 NOTE — Progress Notes (Signed)
[facility] Progress Note  Patient: Jesse BravoCourey Sabina ZOX:[id number]   PCP: System, Pcp Not In DOB: 1995-08-21   DOA: 02/18/2018   DOS: 02/19/2018   Date of Service: the patient was seen and examined on 02/19/2018  Subjective: Had an episode of fever.  No nausea no vomiting.  Complains about having dyspnea on exertion.  He also has cough.  No diarrhea reported.  No difficulty swallowing.  Eating okay.  Brief hospital course: Pt. with PMH of HIV; admitted on 02/18/2018, presented with complaint of shortness of breath, was found to have PCP pneumonia and oral candidiasis. Currently further plan is continue current treatment.  Assessment and Plan: 1.  PCP pneumonia. HIV disease-AIDS Oral candidiasis History of syphilis with question of treatment.  Dose of breath seen in urgent care and was given azithromycin.  Due to reported [facility] at [facility] patient was brought here for admission. Chest x-ray is suggestive of peribronchial thickening-bronchiolitis. Oral thrush is present Tachycardic but not tachypneic. Patient is placed on 2 L of oxygen although no hypoxia documented. Discussed with RN, will check oxygen evaluation at rest and ambulation. Since I do not suspect that the patient is hypoxic currently holding off on the steroids. Started on IV Bactrim.  We will continue following recommendation from ID. Also started on fluconazole for oral candidiasis which we will continue. Negative currently pending. GC chlamydia probe pending. RPR pending LDH elevated Hepatitis panel negative.  Plan CD4 count 10, percent H2 Blood cultures ordered. Nystatin discontinued. If the patient is hypoxic will add steroids. Appreciate infectious disease, they will be arranging adherence nurse to follow him outpatient. Started on KingsBiktarvy.  Diet: regular diet DVT Prophylaxis: subcutaneous Heparin Advance goals of care discussion: full code  Family Communication: family was present at  bedside, at the time of interview. The pt provided permission to discuss medical plan with the family. Opportunity was given to ask question and all questions were answered satisfactorily.   Disposition:  Discharge to home.  Consultants: ID Procedures: none  Antibiotics: Anti-infectives (From admission, onward)   Start     Dose/Rate Route Frequency Ordered Stop   02/18/18 1300  bictegravir-emtricitabine-tenofovir AF (BIKTARVY) 50-200-25 MG per tablet 1 tablet     1 tablet Oral Daily 02/18/18 1222     02/18/18 1200  fluconazole (DIFLUCAN) IVPB 100 mg     100 mg 50 mL/hr over 60 Minutes Intravenous Every 24 hours 02/18/18 1058     02/18/18 0900  sulfamethoxazole-trimethoprim (BACTRIM) 360 mg in dextrose 5 % 500 mL IVPB     360 mg 348.3 mL/hr over 90 Minutes Intravenous Every 8 hours 02/18/18 0826         Objective: Physical Exam: Vitals:   02/18/18 2202 02/19/18 0446 02/19/18 1409 02/19/18 1500  BP: 139/71 131/77 114/61   Pulse: (!) 103 (!) 105 (!) 103   Resp: 17 19 16    Temp: 98.9 F (37.2 C) 99.9 F (37.7 C) (!) 102 F (38.9 C) 98.5 F (36.9 C)  TempSrc:  Oral Oral Oral  SpO2: 100% 100% 100%   Weight:      Height:        Intake/Output Summary (Last 24 hours) at 02/19/2018 1559 Last data filed at 02/19/2018 1002 Gross per 24 hour  Intake 1473.82 ml  Output 200 ml  Net 1273.82 ml   Filed Weights   02/18/18 0619  Weight: 72.6 kg (160 lb)   General: Alert, Awake and Oriented to Time, Place and Person. Appear in  mild distress, affect appropriate Eyes: PERRL, Conjunctiva normal ENT: Oral Mucosa shows thrush Neck: no JVD, no Abnormal Mass Or lumps Cardiovascular: S1 and S2 Present, no Murmur, Peripheral Pulses Present Respiratory: normal respiratory effort, Bilateral Air entry equal and Decreased, no use of accessory muscle, faint basal Crackles, no wheezes Abdomen: Bowel Sound present, Soft and no tenderness, no hernia Skin: no redness, no Rash, no  induration Extremities: no Pedal edema, no calf tenderness Neurologic: Grossly no focal neuro deficit. Bilaterally Equal motor strength  Data Reviewed: CBC: Recent Labs  Lab 02/18/18 0727 02/19/18 0526  WBC 9.9 9.2  NEUTROABS 7.9*  --   HGB 14.4 13.2  HCT 39.4 37.5*  MCV 79.0 82.4  PLT 309 325   Basic Metabolic Panel: Recent Labs  Lab 02/18/18 0727 02/19/18 0526  NA 131* 133*  K 4.0 4.0  CL 99 100  CO2 22 22  GLUCOSE 127* 115*  BUN 9 7  CREATININE 0.79 0.74  CALCIUM 8.9 8.9    Liver Function Tests: Recent Labs  Lab 02/19/18 0526  AST 17  ALT 11  ALKPHOS 72  BILITOT 0.9  PROT 7.7  ALBUMIN 2.9*   No results for input(s): LIPASE, AMYLASE in the last 168 hours. No results for input(s): AMMONIA in the last 168 hours. Coagulation Profile: No results for input(s): INR, PROTIME in the last 168 hours. Cardiac Enzymes: No results for input(s): CKTOTAL, CKMB, CKMBINDEX, TROPONINI in the last 168 hours. BNP (last 3 results) No results for input(s): PROBNP in the last 8760 hours. CBG: No results for input(s): GLUCAP in the last 168 hours. Studies: No results found.  Scheduled Meds: . bictegravir-emtricitabine-tenofovir AF  1 tablet Oral Daily  . chlorhexidine  15 mL Mouth Rinse BID  . guaiFENesin  600 mg Oral BID  . mouth rinse  15 mL Mouth Rinse q12n4p   Continuous Infusions: . sodium chloride 50 mL/hr at 02/19/18 1242  . fluconazole (DIFLUCAN) IV Stopped (02/19/18 1355)  . sulfamethoxazole-trimethoprim 360 mg (02/19/18 1455)   PRN Meds: acetaminophen **OR** acetaminophen, benzonatate, ondansetron **OR** ondansetron (ZOFRAN) IV  Time spent: 35 minutes  Author: Lynden Oxford, MD [facility] Pager: [phone] 02/19/2018 3:59 PM  If 7PM-7AM, please contact night-coverage at [email or web address], password [facility]

## 2018-02-19 NOTE — Progress Notes (Addendum)
Oxygen at rest on room air 98%, HR 98 at rest. Ambulated patient on room air 93%. Heart rate went up to 145 while ambulating . Patient felt short of breath at this time. Notified MD of findings.

## 2018-02-20 ENCOUNTER — Inpatient Hospital Stay (HOSPITAL_COMMUNITY): Payer: Self-pay

## 2018-02-20 DIAGNOSIS — I361 Nonrheumatic tricuspid (valve) insufficiency: Secondary | ICD-10-CM

## 2018-02-20 LAB — MAGNESIUM: Magnesium: 2.1 mg/dL (ref 1.7–2.4)

## 2018-02-20 LAB — LACTIC ACID, PLASMA: LACTIC ACID, VENOUS: 1 mmol/L (ref 0.5–1.9)

## 2018-02-20 LAB — CBC WITH DIFFERENTIAL/PLATELET
Basophils Absolute: 0 10*3/uL (ref 0.0–0.1)
Basophils Relative: 0 %
EOS ABS: 0 10*3/uL (ref 0.0–0.7)
EOS PCT: 0 %
HCT: 38.4 % — ABNORMAL LOW (ref 39.0–52.0)
Hemoglobin: 13.5 g/dL (ref 13.0–17.0)
LYMPHS ABS: 1 10*3/uL (ref 0.7–4.0)
Lymphocytes Relative: 14 %
MCH: 28.8 pg (ref 26.0–34.0)
MCHC: 35.2 g/dL (ref 30.0–36.0)
MCV: 82.1 fL (ref 78.0–100.0)
MONO ABS: 0.7 10*3/uL (ref 0.1–1.0)
Monocytes Relative: 11 %
Neutro Abs: 5 10*3/uL (ref 1.7–7.7)
Neutrophils Relative %: 75 %
PLATELETS: 347 10*3/uL (ref 150–400)
RBC: 4.68 MIL/uL (ref 4.22–5.81)
RDW: 12 % (ref 11.5–15.5)
WBC: 6.7 10*3/uL (ref 4.0–10.5)

## 2018-02-20 LAB — COMPREHENSIVE METABOLIC PANEL
ALT: 12 U/L (ref 0–44)
ANION GAP: 10 (ref 5–15)
AST: 20 U/L (ref 15–41)
Albumin: 3.1 g/dL — ABNORMAL LOW (ref 3.5–5.0)
Alkaline Phosphatase: 74 U/L (ref 38–126)
BUN: 6 mg/dL (ref 6–20)
CHLORIDE: 98 mmol/L (ref 98–111)
CO2: 26 mmol/L (ref 22–32)
Calcium: 9.4 mg/dL (ref 8.9–10.3)
Creatinine, Ser: 0.73 mg/dL (ref 0.61–1.24)
GFR calc non Af Amer: >60 mL/min (ref >60)
Glucose, Bld: 106 mg/dL — ABNORMAL HIGH (ref 70–99)
POTASSIUM: 4 mmol/L (ref 3.5–5.1)
SODIUM: 134 mmol/L — AB (ref 135–145)
Total Bilirubin: 0.8 mg/dL (ref 0.3–1.2)
Total Protein: 8.4 g/dL — ABNORMAL HIGH (ref 6.5–8.1)

## 2018-02-20 LAB — TSH: TSH: 0.398 u[IU]/mL (ref 0.350–4.500)

## 2018-02-20 LAB — ECHOCARDIOGRAM COMPLETE
Height: 75 in
Weight: 2560 oz

## 2018-02-20 MED ORDER — FLUCONAZOLE IN SODIUM CHLORIDE 200-0.9 MG/100ML-% IV SOLN
200.0000 mg | INTRAVENOUS | Status: DC
  Administered 2018-02-21: 200 mg via INTRAVENOUS
  Filled 2018-02-20: qty 100

## 2018-02-20 NOTE — Progress Notes (Signed)
  Echocardiogram 2D Echocardiogram has been performed.  Retha Bither T Camika Marsico 02/20/2018, 12:19 PM

## 2018-02-20 NOTE — Progress Notes (Signed)
[facility] Progress Note  Patient: Jesse BravoCourey Andrews ZOX:[id number]   PCP: System, Pcp Not In DOB: 10/15/1995   DOA: 02/18/2018   DOS: 02/20/2018   Date of Service: the patient was seen and examined on 02/20/2018  Subjective: Still has dyspnea on exertion.  No chest pain abdominal pain no nausea no vomiting.  No dizziness or lightheadedness.  Cough is present and getting better.  Brief hospital course: Pt. with PMH of HIV; admitted on 02/18/2018, presented with complaint of shortness of breath, was found to have PCP pneumonia and oral candidiasis. Currently further plan is continue current treatment.  Assessment and Plan: 1.  PCP pneumonia. HIV disease-AIDS Oral candidiasis History of syphilis with question of treatment.  Dose of breath seen in urgent care and was given azithromycin.  Due to [facility] patient was brought here for admission. Chest x-ray is suggestive of peribronchial thickening-bronchiolitis. Oral thrush is present Tachycardic but not tachypneic. Patient is placed on 2 L of oxygen although no hypoxia documented. Oxygen evaluation both at rest as well as on ambulation was unremarkable for any hypoxia. Since patient is not  hypoxic currently holding off on the steroids. Started on IV Bactrim.  We will continue following recommendation from ID. Also started on fluconazole for oral candidiasis which we will continue. Negative currently pending. GC chlamydia probe pending. RPR pending LDH elevated Hepatitis panel negative.   CD4 count 10, percent H2 Blood cultures ordered. Nystatin discontinued. If the patient is hypoxic will add steroids. Appreciate infectious disease, they will be arranging adherence nurse to follow him outpatient. Started on [place].  2.  Sinus tachycardia. Patient found to have sinus tachycardia worsening with exertion. Does not appear to be volume overloaded. Does not appear to be volume depleted as  well. Denies any chest pain or any pain anywhere else. Sepsis physiology appears to have resolved other than the tachycardia. Echocardiogram performed shows preserved EF and no other acute abnormality. No concern for PE. Thyroid panel work-up currently pending although TSH levels are low normal we will monitor free T4.  Diet: regular diet DVT Prophylaxis: subcutaneous Heparin Advance goals of care discussion: full code  Family Communication: family was present at bedside, at the time of interview. The pt provided permission to discuss medical plan with the family. Opportunity was given to ask question and all questions were answered satisfactorily.   Disposition:  Discharge to home.  Consultants: ID Procedures: none  Antibiotics: Anti-infectives (From admission, onward)   Start     Dose/Rate Route Frequency Ordered Stop   02/18/18 1300  bictegravir-emtricitabine-tenofovir AF (BIKTARVY) 50-200-25 MG per tablet 1 tablet     1 tablet Oral Daily 02/18/18 1222     02/18/18 1200  fluconazole (DIFLUCAN) IVPB 100 mg     100 mg 50 mL/hr over 60 Minutes Intravenous Every 24 hours 02/18/18 1058     02/18/18 0900  sulfamethoxazole-trimethoprim (BACTRIM) 360 mg in dextrose 5 % 500 mL IVPB     360 mg 348.3 mL/hr over 90 Minutes Intravenous Every 8 hours 02/18/18 0826         Objective: Physical Exam: Vitals:   02/19/18 2021 02/19/18 2115 02/20/18 0506 02/20/18 1443  BP: (!) 144/63  123/76 134/74  Pulse: (!) 105  87 97  Resp: 18  19 18   Temp: 100.2 F (37.9 C) (!) 100.6 F (38.1 C) 98.4 F (36.9 C)   TempSrc: Oral Oral Oral   SpO2: 96%  100% 95%  Weight:  Height:        Intake/Output Summary (Last 24 hours) at 02/20/2018 1552 Last data filed at 02/20/2018 0900 Gross per 24 hour  Intake 985 ml  Output -  Net 985 ml   Filed Weights   02/18/18 0619  Weight: 72.6 kg (160 lb)   General: Alert, Awake and Oriented to Time, Place and Person. Appear in mild distress, affect  appropriate Eyes: PERRL, Conjunctiva normal ENT: Oral Mucosa shows thrush Neck: no JVD, no Abnormal Mass Or lumps Cardiovascular: S1 and S2 Present, no Murmur, Peripheral Pulses Present Respiratory: normal respiratory effort, Bilateral Air entry equal and Decreased, no use of accessory muscle, faint basal Crackles, no wheezes Abdomen: Bowel Sound present, Soft and no tenderness, no hernia Skin: no redness, no Rash, no induration Extremities: no Pedal edema, no calf tenderness Neurologic: Grossly no focal neuro deficit. Bilaterally Equal motor strength  Data Reviewed: CBC: Recent Labs  Lab 02/18/18 0727 02/19/18 0526 02/20/18 0521  WBC 9.9 9.2 6.7  NEUTROABS 7.9*  --  5.0  HGB 14.4 13.2 13.5  HCT 39.4 37.5* 38.4*  MCV 79.0 82.4 82.1  PLT 309 325 347   Basic Metabolic Panel: Recent Labs  Lab 02/18/18 0727 02/19/18 0526 02/20/18 0521  NA 131* 133* 134*  K 4.0 4.0 4.0  CL 99 100 98  CO2 22 22 26   GLUCOSE 127* 115* 106*  BUN 9 7 6   CREATININE 0.79 0.74 0.73  CALCIUM 8.9 8.9 9.4  MG  --   --  2.1    Liver Function Tests: Recent Labs  Lab 02/19/18 0526 02/20/18 0521  AST 17 20  ALT 11 12  ALKPHOS 72 74  BILITOT 0.9 0.8  PROT 7.7 8.4*  ALBUMIN 2.9* 3.1*   No results for input(s): LIPASE, AMYLASE in the last 168 hours. No results for input(s): AMMONIA in the last 168 hours. Coagulation Profile: No results for input(s): INR, PROTIME in the last 168 hours. Cardiac Enzymes: No results for input(s): CKTOTAL, CKMB, CKMBINDEX, TROPONINI in the last 168 hours. BNP (last 3 results) No results for input(s): PROBNP in the last 8760 hours. CBG: No results for input(s): GLUCAP in the last 168 hours. Studies: No results found.  Scheduled Meds: . bictegravir-emtricitabine-tenofovir AF  1 tablet Oral Daily  . chlorhexidine  15 mL Mouth Rinse BID  . guaiFENesin  600 mg Oral BID  . mouth rinse  15 mL Mouth Rinse q12n4p   Continuous Infusions: . sodium chloride 50 mL/hr  at 02/20/18 1526  . fluconazole (DIFLUCAN) IV Stopped (02/20/18 1310)  . sulfamethoxazole-trimethoprim 360 mg (02/20/18 1321)   PRN Meds: acetaminophen **OR** acetaminophen, benzonatate, ondansetron **OR** ondansetron (ZOFRAN) IV  Time spent: 35 minutes  Author: Lynden Oxford, MD [facility] Pager: [phone] 02/20/2018 3:52 PM  If 7PM-7AM, please contact night-coverage at [email or web address], password [email or web address]

## 2018-02-20 NOTE — Progress Notes (Signed)
    [facility]    Date of Admission:  02/18/2018   Total days of antibiotics 3           ID: Jesse Andrews SessionCasterlow is a 22 y.o. male with advanced hiv disease, CD 4 count of 10/VL pending off of ART. Being treated for presumed PCP and oral thrush Active Problems:   HIV disease (HCC)   Hypoxia   Oral thrush   PCP (pneumocystis carinii pneumonia) (HCC)    Subjective: Fever curve is trending down. Found to have tachycardia when ambulating, pulse ox down to 93% when ambulating  Medications:  . bictegravir-emtricitabine-tenofovir AF  1 tablet Oral Daily  . chlorhexidine  15 mL Mouth Rinse BID  . guaiFENesin  600 mg Oral BID  . mouth rinse  15 mL Mouth Rinse q12n4p    Objective: Vital signs in last 24 hours: Temp:  [98.4 F (36.9 C)-100.6 F (38.1 C)] 98.4 F (36.9 C) (07/07 0506) Pulse Rate:  [87-105] 97 (07/07 1443) Resp:  [18-19] 18 (07/07 1443) BP: (123-144)/(63-76) 134/74 (07/07 1443) SpO2:  [95 %-100 %] 95 % (07/07 1443)    Lab Results Recent Labs    02/19/18 0526 02/20/18 0521  WBC 9.2 6.7  HGB 13.2 13.5  HCT 37.5* 38.4*  NA 133* 134*  K 4.0 4.0  CL 100 98  CO2 22 26  BUN 7 6  CREATININE 0.74 0.73   Liver Panel Recent Labs    02/19/18 0526 02/20/18 0521  PROT 7.7 8.4*  ALBUMIN 2.9* 3.1*  AST 17 20  ALT 11 12  ALKPHOS 72 74  BILITOT 0.9 0.8    Microbiology: reviewed Studies/Results: No results found.   Assessment/Plan: hiv disease= continue on biktarvy. Will need to get access to medications prior to discharge. VL still pending  OI proph = will give azithromycin 1200mg  weekly schedule for tomorrow  Presumed PCP = continue on IV bactrim and will transition to orals once afebrile x 24-48hrs  Oral thrush= increased dose to fluconazole 200mg . Currently IV. Once improved swallowing can switch to orals and plan to treat for 10-14 days  STI screening = pending. Hx of syphilis. Await RPR as well  Dr hatcher to see on  monday  [facility] Cell: [phone] Pager: [phone]  02/20/2018, 5:01 PM

## 2018-02-21 ENCOUNTER — Other Ambulatory Visit: Payer: Self-pay | Admitting: Pharmacist Clinician (PhC)/ Clinical Pharmacy Specialist

## 2018-02-21 DIAGNOSIS — B59 Pneumocystosis: Secondary | ICD-10-CM

## 2018-02-21 DIAGNOSIS — Z8619 Personal history of other infectious and parasitic diseases: Secondary | ICD-10-CM

## 2018-02-21 DIAGNOSIS — B370 Candidal stomatitis: Secondary | ICD-10-CM

## 2018-02-21 LAB — GC/CHLAMYDIA PROBE AMP (~~LOC~~) NOT AT ARMC
CHLAMYDIA, DNA PROBE: NEGATIVE
NEISSERIA GONORRHEA: NEGATIVE

## 2018-02-21 LAB — RPR, QUANT+TP ABS (REFLEX)
Rapid Plasma Reagin, Quant: 1:8 {titer} — ABNORMAL HIGH
TREPONEMA PALLIDUM AB: POSITIVE — AB

## 2018-02-21 LAB — RPR: RPR Ser Ql: REACTIVE — AB

## 2018-02-21 LAB — T4, FREE: Free T4: 0.94 ng/dL (ref 0.82–1.77)

## 2018-02-21 MED ORDER — PNEUMOCOCCAL 13-VAL CONJ VACC IM SUSP
0.5000 mL | INTRAMUSCULAR | Status: AC
  Administered 2018-02-22: 0.5 mL via INTRAMUSCULAR
  Filled 2018-02-21: qty 0.5

## 2018-02-21 MED ORDER — HEPATITIS A VACCINE 1440 EL U/ML IM SUSP
1.0000 mL | Freq: Once | INTRAMUSCULAR | Status: AC
  Administered 2018-02-21: 1440 [IU] via INTRAMUSCULAR
  Filled 2018-02-21: qty 1

## 2018-02-21 MED ORDER — HEPATITIS B VAC RECOMBINANT 10 MCG/ML IJ SUSP
1.0000 mL | Freq: Once | INTRAMUSCULAR | Status: DC
  Filled 2018-02-21: qty 1

## 2018-02-21 MED ORDER — HEPATITIS A VACCINE 1440 EL U/ML IM SUSP
1.0000 mL | Freq: Once | INTRAMUSCULAR | Status: DC
  Filled 2018-02-21: qty 1

## 2018-02-21 MED ORDER — AZITHROMYCIN 600 MG PO TABS
1200.0000 mg | ORAL_TABLET | ORAL | Status: DC
  Administered 2018-02-21: 1200 mg via ORAL
  Filled 2018-02-21: qty 2

## 2018-02-21 MED ORDER — FLUCONAZOLE 200 MG PO TABS
200.0000 mg | ORAL_TABLET | Freq: Every day | ORAL | Status: DC
  Administered 2018-02-22: 200 mg via ORAL
  Filled 2018-02-21: qty 1

## 2018-02-21 MED ORDER — BICTEGRAVIR-EMTRICITAB-TENOFOV 50-200-25 MG PO TABS
1.0000 | ORAL_TABLET | Freq: Every day | ORAL | 3 refills | Status: DC
Start: 2018-02-21 — End: 2018-06-13

## 2018-02-21 MED ORDER — SULFAMETHOXAZOLE-TRIMETHOPRIM 800-160 MG PO TABS
2.0000 | ORAL_TABLET | Freq: Three times a day (TID) | ORAL | Status: DC
  Administered 2018-02-21 – 2018-02-22 (×3): 2 via ORAL
  Filled 2018-02-21 (×3): qty 2

## 2018-02-21 MED ORDER — HEPATITIS B VAC RECOMBINANT 10 MCG/0.5ML IJ SUSP
0.5000 mL | Freq: Once | INTRAMUSCULAR | Status: AC
  Administered 2018-02-21: 0.5 mL via INTRAMUSCULAR
  Filled 2018-02-21: qty 0.5

## 2018-02-21 MED FILL — BIKTARVY 50-200-25 MG TABS: 50-200-25 | 30 days supply | Qty: 30 | Fill #0

## 2018-02-21 NOTE — Progress Notes (Signed)
[facility] Progress Note  Patient: Jesse Andrews ZOX:[id number]   PCP: System, Pcp Not In DOB: [birth year]   DOA: 02/18/2018   DOS: 02/21/2018   Date of Service: the patient was seen and examined on 02/21/2018  Subjective: feeling better, no acute complains at present  Brief hospital course: Pt. with PMH of HIV; admitted on 02/18/2018, presented with complaint of shortness of breath, was found to have PCP pneumonia and oral candidiasis. Currently further plan is continue current treatment.  Assessment and Plan: 1.  PCP pneumonia. HIV disease-AIDS Oral candidiasis History of syphilis with question of treatment.  Dose of breath seen in urgent care and was given azithromycin.  Due to reported hypoxia at [facility] patient was brought here for admission. Chest x-ray is suggestive of peribronchial thickening-bronchiolitis. Oral thrush is present Tachycardic but not tachypneic. Patient is placed on 2 L of oxygen although no hypoxia documented. Oxygen evaluation both at rest as well as on ambulation was unremarkable for any hypoxia. Since patient is not  hypoxic currently holding off on the steroids. Started on IV Bactrim.  We will continue following recommendation from ID. Also started on fluconazole for oral candidiasis which we will continue. HIV viral load pending GC chlamydia probe pending. RPR pending resent today  LDH elevated Hepatitis panel negative.   CD4 count 10, percent H2 Blood cultures now growth so far Nystatin discontinued. If the patient is hypoxic will add steroids. Appreciate infectious disease, they will be arranging adherence nurse to follow him outpatient. Started on [place]. Now switching to oral bactrim. Likely can go home tomorrow  2.  Sinus tachycardia. Patient found to have sinus tachycardia worsening with exertion. Does not appear to be volume overloaded. Does not appear to be volume depleted as well. Denies any chest pain or any pain  anywhere else. Sepsis physiology appears to have resolved other than the tachycardia. Echocardiogram performed shows preserved EF and no other acute abnormality. No concern for PE. Thyroid panel work-up negative as well.  Diet: regular diet DVT Prophylaxis: subcutaneous Heparin Advance goals of care discussion: full code  Family Communication: NO family was present at bedside, at the time of interview.  Disposition:  Discharge to home.  Consultants: ID Procedures: none  Antibiotics: Anti-infectives (From admission, onward)   Start     Dose/Rate Route Frequency Ordered Stop   02/22/18 1000  fluconazole (DIFLUCAN) tablet 200 mg     200 mg Oral Daily 02/21/18 1557 03/04/18 0959   02/21/18 2200  sulfamethoxazole-trimethoprim (BACTRIM DS,SEPTRA DS) 800-160 MG per tablet 2 tablet     2 tablet Oral Every 8 hours 02/21/18 1557 03/11/18 2159   02/21/18 1200  fluconazole (DIFLUCAN) IVPB 200 mg  Status:  Discontinued     200 mg 100 mL/hr over 60 Minutes Intravenous Every 24 hours 02/20/18 1702 02/21/18 1557   02/21/18 0900  azithromycin (ZITHROMAX) tablet 1,200 mg     1,200 mg Oral Weekly 02/21/18 0728     02/18/18 1300  bictegravir-emtricitabine-tenofovir AF (BIKTARVY) 50-200-25 MG per tablet 1 tablet     1 tablet Oral Daily 02/18/18 1222     02/18/18 1200  fluconazole (DIFLUCAN) IVPB 100 mg  Status:  Discontinued     100 mg 50 mL/hr over 60 Minutes Intravenous Every 24 hours 02/18/18 1058 02/20/18 1702   02/18/18 0900  sulfamethoxazole-trimethoprim (BACTRIM) 360 mg in dextrose 5 % 500 mL IVPB  Status:  Discontinued     360 mg 348.3 mL/hr over 90 Minutes Intravenous Every  8 hours 02/18/18 0826 02/21/18 1557       Objective: Physical Exam: Vitals:   02/20/18 1443 02/20/18 2016 02/21/18 0557 02/21/18 1427  BP: 134/74 129/75 110/71 134/72  Pulse: 97 90 89 89  Resp: 18 19 16 17   Temp: 100.1 F (37.8 C) 98.5 F (36.9 C) 98.7 F (37.1 C) 98.8 F (37.1 C)  TempSrc: Oral Oral Oral  Oral  SpO2: 95% 97% 99% 99%  Weight:      Height:        Intake/Output Summary (Last 24 hours) at 02/21/2018 1617 Last data filed at 02/21/2018 0849 Gross per 24 hour  Intake 1843 ml  Output -  Net 1843 ml   Filed Weights   02/18/18 0619  Weight: 72.6 kg (160 lb)   General: Alert, Awake and Oriented to Time, Place and Person. Appear in mild distress, affect appropriate Eyes: PERRL, Conjunctiva normal ENT: Oral Mucosa shows thrush Neck: no JVD, no Abnormal Mass Or lumps Cardiovascular: S1 and S2 Present, no Murmur, Peripheral Pulses Present Respiratory: normal respiratory effort, Bilateral Air entry equal and Decreased, no use of accessory muscle, faint basal Crackles, no wheezes Abdomen: Bowel Sound present, Soft and no tenderness, no hernia Skin: no redness, no Rash, no induration Extremities: no Pedal edema, no calf tenderness Neurologic: Grossly no focal neuro deficit. Bilaterally Equal motor strength  Data Reviewed: CBC: Recent Labs  Lab 02/18/18 0727 02/19/18 0526 02/20/18 0521  WBC 9.9 9.2 6.7  NEUTROABS 7.9*  --  5.0  HGB 14.4 13.2 13.5  HCT 39.4 37.5* 38.4*  MCV 79.0 82.4 82.1  PLT 309 325 347   Basic Metabolic Panel: Recent Labs  Lab 02/18/18 0727 02/19/18 0526 02/20/18 0521  NA 131* 133* 134*  K 4.0 4.0 4.0  CL 99 100 98  CO2 22 22 26   GLUCOSE 127* 115* 106*  BUN 9 7 6   CREATININE 0.79 0.74 0.73  CALCIUM 8.9 8.9 9.4  MG  --   --  2.1    Liver Function Tests: Recent Labs  Lab 02/19/18 0526 02/20/18 0521  AST 17 20  ALT 11 12  ALKPHOS 72 74  BILITOT 0.9 0.8  PROT 7.7 8.4*  ALBUMIN 2.9* 3.1*   No results for input(s): LIPASE, AMYLASE in the last 168 hours. No results for input(s): AMMONIA in the last 168 hours. Coagulation Profile: No results for input(s): INR, PROTIME in the last 168 hours. Cardiac Enzymes: No results for input(s): CKTOTAL, CKMB, CKMBINDEX, TROPONINI in the last 168 hours. BNP (last 3 results) No results for input(s):  PROBNP in the last 8760 hours. CBG: No results for input(s): GLUCAP in the last 168 hours. Studies: No results found.  Scheduled Meds: . azithromycin  1,200 mg Oral Weekly  . bictegravir-emtricitabine-tenofovir AF  1 tablet Oral Daily  . chlorhexidine  15 mL Mouth Rinse BID  . [START ON 02/22/2018] fluconazole  200 mg Oral Daily  . guaiFENesin  600 mg Oral BID  . [START ON 02/22/2018] hepatitis A virus (PF) vaccine  1 mL Intramuscular Once  . hepatitis b vaccine for adults  1 mL Intramuscular Once  . mouth rinse  15 mL Mouth Rinse q12n4p  . [START ON 02/22/2018] pneumococcal 13-valent conjugate vaccine  0.5 mL Intramuscular Tomorrow-1000  . sulfamethoxazole-trimethoprim  2 tablet Oral Q8H   Continuous Infusions: . sodium chloride 50 mL/hr at 02/20/18 1526   PRN Meds: acetaminophen **OR** acetaminophen, benzonatate, ondansetron **OR** ondansetron (ZOFRAN) IV  Time spent: 35 minutes  Author: Edsel Petrin  Allena KatzPatel, MD [facility] Pager: [phone] 02/21/2018 4:17 PM  If 7PM-7AM, please contact night-coverage at [email or web address], password [email or web address]

## 2018-02-21 NOTE — Progress Notes (Addendum)
INFECTIOUS DISEASE PROGRESS NOTE  ID: Jesse Andrews is a 22 y.o. male with  Active Problems:   HIV disease (HCC)   Hypoxia   Oral thrush   PCP (pneumocystis carinii pneumonia) (HCC)  Subjective: No SOB, no cough, no dysphagia.   Abtx:  Anti-infectives (From admission, onward)   Start     Dose/Rate Route Frequency Ordered Stop   02/21/18 1200  fluconazole (DIFLUCAN) IVPB 200 mg     200 mg 100 mL/hr over 60 Minutes Intravenous Every 24 hours 02/20/18 1702     02/21/18 0900  azithromycin (ZITHROMAX) tablet 1,200 mg     1,200 mg Oral Weekly 02/21/18 0728     02/18/18 1300  bictegravir-emtricitabine-tenofovir AF (BIKTARVY) 50-200-25 MG per tablet 1 tablet     1 tablet Oral Daily 02/18/18 1222     02/18/18 1200  fluconazole (DIFLUCAN) IVPB 100 mg  Status:  Discontinued     100 mg 50 mL/hr over 60 Minutes Intravenous Every 24 hours 02/18/18 1058 02/20/18 1702   02/18/18 0900  sulfamethoxazole-trimethoprim (BACTRIM) 360 mg in dextrose 5 % 500 mL IVPB     360 mg 348.3 mL/hr over 90 Minutes Intravenous Every 8 hours 02/18/18 0826        Medications:  Scheduled: . azithromycin  1,200 mg Oral Weekly  . bictegravir-emtricitabine-tenofovir AF  1 tablet Oral Daily  . chlorhexidine  15 mL Mouth Rinse BID  . guaiFENesin  600 mg Oral BID  . mouth rinse  15 mL Mouth Rinse q12n4p    Objective: Vital signs in last 24 hours: Temp:  [98.5 F (36.9 C)-98.8 F (37.1 C)] 98.8 F (37.1 C) (07/08 1427) Pulse Rate:  [89-90] 89 (07/08 1427) Resp:  [16-19] 17 (07/08 1427) BP: (110-134)/(71-75) 134/72 (07/08 1427) SpO2:  [97 %-99 %] 99 % (07/08 1427)   General appearance: alert, cooperative and no distress Throat: lips, mucosa, and tongue normal; teeth and gums normal Resp: clear to auscultation bilaterally Cardio: regular rate and rhythm GI: normal findings: bowel sounds normal and soft, non-tender Extremities: edema none  Lab Results Recent Labs    02/19/18 0526 02/20/18 0521    WBC 9.2 6.7  HGB 13.2 13.5  HCT 37.5* 38.4*  NA 133* 134*  K 4.0 4.0  CL 100 98  CO2 22 26  BUN 7 6  CREATININE 0.74 0.73   Liver Panel Recent Labs    02/19/18 0526 02/20/18 0521  PROT 7.7 8.4*  ALBUMIN 2.9* 3.1*  AST 17 20  ALT 11 12  ALKPHOS 72 74  BILITOT 0.9 0.8   Sedimentation Rate No results for input(s): ESRSEDRATE in the last 72 hours. C-Reactive Protein No results for input(s): CRP in the last 72 hours.  Microbiology: Recent Results (from the past 240 hour(s))  Culture, blood (routine x 2)     Status: None (Preliminary result)   Collection Time: 02/19/18  4:15 PM  Result Value Ref Range Status   Specimen Description BLOOD RIGHT ARM  Final   Special Requests   Final    BOTTLES DRAWN AEROBIC AND ANAEROBIC Blood Culture adequate volume Performed at [facility], [address], [place], [place] [address]    Culture   Final    NO GROWTH 2 DAYS Performed at [facility], [address] [place], [place] [address]    Report Status PENDING  Incomplete  Culture, blood (routine x 2)     Status: None (Preliminary result)   Collection Time: 02/19/18  4:21 PM  Result Value Ref Range Status   Specimen Description BLOOD RIGHT HAND  Final   Special Requests   Final    BOTTLES DRAWN AEROBIC AND ANAEROBIC Blood Culture adequate volume Performed at [facility], [address] [place], [place] [address]    Culture   Final    NO GROWTH 2 DAYS Performed at [facility], [address] [place], [place] [address]    Report Status PENDING  Incomplete    Studies/Results: No results found.   Assessment/Plan: AIDS Suspected PCP/PJP Thrush Hx of syphilis  Total days of antibiotics: 3 bactrim  Give twinrix Give prevnar Continue biktarvy Change to po fluconazole Change to po bactrim Continue weekly azithro will re-order STI (sexually transmitted infection) screens If he continues to do well, consider d/c  tomorrow         Johny SaxJeffrey Tola Meas MD, FACP Infectious Diseases (pager) [phone] [email or web address] 02/21/2018, 3:46 PM  LOS: 3 days

## 2018-02-21 NOTE — Progress Notes (Signed)
[facility]. Copay will be taken care of with copay card.

## 2018-02-22 DIAGNOSIS — Z880 Allergy status to penicillin: Secondary | ICD-10-CM

## 2018-02-22 LAB — HIV-1 RNA QUANT-NO REFLEX-BLD
HIV 1 RNA QUANT: 147000 {copies}/mL
LOG10 HIV-1 RNA: 5.167 {Log_copies}/mL

## 2018-02-22 LAB — RPR, QUANT+TP ABS (REFLEX)
Rapid Plasma Reagin, Quant: 1:2 {titer} — ABNORMAL HIGH
TREPONEMA PALLIDUM AB: POSITIVE — AB

## 2018-02-22 LAB — GC/CHLAMYDIA PROBE AMP (~~LOC~~) NOT AT ARMC
Chlamydia: NEGATIVE
Neisseria Gonorrhea: NEGATIVE

## 2018-02-22 LAB — RPR: RPR: REACTIVE — AB

## 2018-02-22 MED ORDER — FLUCONAZOLE 200 MG PO TABS: 200.0000 mg | ORAL_TABLET | Freq: Every day | ORAL | 0 refills | Status: AC

## 2018-02-22 MED ORDER — GUAIFENESIN ER 600 MG PO TB12: 600.0000 mg | ORAL_TABLET | Freq: Two times a day (BID) | ORAL | 0 refills | Status: DC

## 2018-02-22 MED ORDER — BENZONATATE 100 MG PO CAPS: 100.0000 mg | ORAL_CAPSULE | Freq: Three times a day (TID) | ORAL | 0 refills | Status: DC | PRN

## 2018-02-22 MED ORDER — SULFAMETHOXAZOLE-TRIMETHOPRIM 800-160 MG PO TABS: 2.0000 | ORAL_TABLET | Freq: Three times a day (TID) | ORAL | 0 refills | Status: AC

## 2018-02-22 NOTE — Progress Notes (Signed)
INFECTIOUS DISEASE PROGRESS NOTE  ID: Jesse BravoCourey Andrews is a 22 y.o. male with  Active Problems:   HIV disease (HCC)   Hypoxia   Oral thrush   PCP (pneumocystis carinii pneumonia) (HCC)  Subjective: No cough or sob No dysphagia  Abtx:  Anti-infectives (From admission, onward)   Start     Dose/Rate Route Frequency Ordered Stop   02/23/18 0000  fluconazole (DIFLUCAN) 200 MG tablet     200 mg Oral Daily 02/22/18 1056 03/04/18 2359   02/22/18 1000  fluconazole (DIFLUCAN) tablet 200 mg     200 mg Oral Daily 02/21/18 1557 03/04/18 0959   02/22/18 0000  sulfamethoxazole-trimethoprim (BACTRIM DS,SEPTRA DS) 800-160 MG tablet     2 tablet Oral Every 8 hours 02/22/18 1056 03/12/18 2359   02/21/18 2200  sulfamethoxazole-trimethoprim (BACTRIM DS,SEPTRA DS) 800-160 MG per tablet 2 tablet     2 tablet Oral Every 8 hours 02/21/18 1557 03/11/18 2159   02/21/18 1200  fluconazole (DIFLUCAN) IVPB 200 mg  Status:  Discontinued     200 mg 100 mL/hr over 60 Minutes Intravenous Every 24 hours 02/20/18 1702 02/21/18 1557   02/21/18 0900  azithromycin (ZITHROMAX) tablet 1,200 mg     1,200 mg Oral Weekly 02/21/18 0728     02/18/18 1300  bictegravir-emtricitabine-tenofovir AF (BIKTARVY) 50-200-25 MG per tablet 1 tablet     1 tablet Oral Daily 02/18/18 1222     02/18/18 1200  fluconazole (DIFLUCAN) IVPB 100 mg  Status:  Discontinued     100 mg 50 mL/hr over 60 Minutes Intravenous Every 24 hours 02/18/18 1058 02/20/18 1702   02/18/18 0900  sulfamethoxazole-trimethoprim (BACTRIM) 360 mg in dextrose 5 % 500 mL IVPB  Status:  Discontinued     360 mg 348.3 mL/hr over 90 Minutes Intravenous Every 8 hours 02/18/18 0826 02/21/18 1557      Medications:  Scheduled: . azithromycin  1,200 mg Oral Weekly  . bictegravir-emtricitabine-tenofovir AF  1 tablet Oral Daily  . chlorhexidine  15 mL Mouth Rinse BID  . fluconazole  200 mg Oral Daily  . guaiFENesin  600 mg Oral BID  . mouth rinse  15 mL Mouth Rinse  [id number]  . sulfamethoxazole-trimethoprim  2 tablet Oral Q8H    Objective: Vital signs in last 24 hours: Temp:  [98.6 F (37 C)-98.8 F (37.1 C)] 98.6 F (37 C) (07/09 0531) Pulse Rate:  [80-90] 80 (07/09 0531) Resp:  [16-17] 16 (07/09 0531) BP: (125-134)/(69-74) 133/74 (07/09 0531) SpO2:  [97 %-100 %] 100 % (07/09 0531)   General appearance: alert, cooperative and no distress Resp: clear to auscultation bilaterally Cardio: regular rate and rhythm GI: normal findings: bowel sounds normal and soft, non-tender  Lab Results Recent Labs    02/20/18 0521  WBC 6.7  HGB 13.5  HCT 38.4*  NA 134*  K 4.0  CL 98  CO2 26  BUN 6  CREATININE 0.73   Liver Panel Recent Labs    02/20/18 0521  PROT 8.4*  ALBUMIN 3.1*  AST 20  ALT 12  ALKPHOS 74  BILITOT 0.8   Sedimentation Rate No results for input(s): ESRSEDRATE in the last 72 hours. C-Reactive Protein No results for input(s): CRP in the last 72 hours.  Microbiology: Recent Results (from the past 240 hour(s))  Culture, blood (routine x 2)     Status: None (Preliminary result)   Collection Time: 02/19/18  4:15 PM  Result Value Ref Range Status   Specimen Description BLOOD RIGHT ARM  Final   Special Requests   Final    BOTTLES DRAWN AEROBIC AND ANAEROBIC Blood Culture adequate volume Performed at [facility], [address] [place], [place] [address]    Culture   Final    NO GROWTH 2 DAYS Performed at [facility], [address] [place], [place] [address]    Report Status PENDING  Incomplete  Culture, blood (routine x 2)     Status: None (Preliminary result)   Collection Time: 02/19/18  4:21 PM  Result Value Ref Range Status   Specimen Description BLOOD RIGHT HAND  Final   Special Requests   Final    BOTTLES DRAWN AEROBIC AND ANAEROBIC Blood Culture adequate volume Performed at [facility], [address] [place], [place] [address]    Culture   Final    NO GROWTH  2 DAYS Performed at [facility], [address], [place], [place] [address]    Report Status PENDING  Incomplete    Studies/Results: No results found.   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 Called [place] dept health- 12-2014 1:256. Treated with doxy 14 days 01-02-15. He refuses PEN injection (told by his parents he is allergic).          Johny Sax MD, FACP Infectious Diseases (pager) [phone] [email or web address] 02/22/2018, 12:49 PM  LOS: 4 days

## 2018-02-24 LAB — CULTURE, BLOOD (ROUTINE X 2)
CULTURE: NO GROWTH
Culture: NO GROWTH
SPECIAL REQUESTS: ADEQUATE
SPECIAL REQUESTS: ADEQUATE

## 2018-02-25 NOTE — Discharge Summary (Signed)
[facility] Discharge Summary   Patient: Jesse Andrews ZOX:[id number]   PCP: System, Pcp Not In DOB: 09-17-1995   Date of admission: 02/18/2018   Date of discharge: 02/22/2018    Discharge Diagnoses:  Active Problems:   HIV disease (HCC)   Hypoxia   Oral thrush   PCP (pneumocystis carinii pneumonia) (HCC)   Admitted From: home Disposition:  home  Recommendations for Outpatient Follow-up:  1. Please follow-up with infectious disease as recommended.  Follow-up Information    Jesse Smart, MD. Schedule an appointment as soon as possible for a visit in 1 month(s).   Specialty:  Infectious Diseases Contact information: [address] [place] [address] [place] [address] [phone]          Diet recommendation: Regular diet  Activity: The patient is advised to gradually reintroduce usual activities.  Discharge Condition: good  Code Status: Full  History of present illness: As per the H and P dictated on admission, " Jesse Andrews is a 22 y.o. male with medical history significant of HIV diagnosed in 2015 (patient denies knowing about this diagnosis although medical records indicate that he was informed of the diagnosis and multiple attempts were made for him to follow with ID clinic), has never been treated presented with worsening cough and shortness of breath for the last few days along with fever.  He was seen at an urgent care and given azithromycin.  His shortness of breath got worse despite antibiotic.  His cough is nonproductive.  He complains of chest discomfort when trying to take deep breaths.  No worsening leg swelling or pain.  He smokes marijuana multiple times a day but does not use tobacco or any other illicit drugs.  He denies weight loss, hemoptysis, night sweats, new rash, nausea, vomiting, diarrhea.  He is currently sexually active with men and at this time he has only one partner.   "  Hospital Course:  Summary of his active problems in the  hospital is as following. 1.  PCP pneumonia. HIV disease-AIDS Oral candidiasis History of syphilis with question of treatment.  Dose of breath seen in urgent care and was given azithromycin.  Due to reported hypoxia at [facility] patient was brought here for admission. Chest x-ray is suggestive of peribronchial thickening-bronchiolitis. Oral thrush is present Tachycardic but not tachypneic. Patient is placed on 2 L of oxygen although no hypoxia documented. Oxygen evaluation both at rest as well as on ambulation was unremarkable for any hypoxia. Since patient is not  hypoxic currently holding off on the steroids. Started on IV Bactrim.  We will continue following recommendation from ID. Also started on fluconazole for oral candidiasis which we will continue. HIV viral load pending GC chlamydia probe pending. RPR pending resent today  LDH elevated Hepatitis panel negative.   CD4 count 10, percent H2 Blood cultures now growth so far Nystatin discontinued. If the patient is hypoxic will add steroids. Appreciate infectious disease, they will be arranging adherence nurse to follow him outpatient. Started on [place]. Now switching to oral bactrim.  2.  Sinus tachycardia. Patient found to have sinus tachycardia worsening with exertion. Does not appear to be volume overloaded. Does not appear to be volume depleted as well. Denies any chest pain or any pain anywhere else. Sepsis physiology appears to have resolved other than the tachycardia. Echocardiogram performed shows preserved EF and no other acute abnormality. No concern for PE. Thyroid panel work-up negative as well.  3.  RPR positive.  Titers are actually trending down. Discussed with ID recommend no treatment necessary right now since the patient was treated in the past.  All other chronic medical condition were stable during the hospitalization.  Patient was ambulatory without any assistance. On the day of the  discharge the patient's vitals were stable , and no other acute medical condition were reported by patient. the patient was felt safe to be discharge at home with family.  Consultants: ID Procedures: Echocardiogram   DISCHARGE MEDICATION: Allergies as of 02/22/2018      Reactions   Penicillins    Childhood allergy, doesn't know      Medication List    STOP taking these medications   clindamycin 300 MG capsule Commonly known as:  CLEOCIN   doxycycline 100 MG tablet Commonly known as:  VIBRA-TABS     TAKE these medications   benzonatate 100 MG capsule Commonly known as:  TESSALON Take 1 capsule (100 mg total) by mouth 3 (three) times daily as needed for cough.   bictegravir-emtricitabine-tenofovir AF 50-200-25 MG Tabs tablet Commonly known as:  BIKTARVY Take 1 tablet by mouth daily.   cetirizine 10 MG tablet Commonly known as:  ZYRTEC Take 1 tablet (10 mg total) by mouth daily.   fluconazole 200 MG tablet Commonly known as:  DIFLUCAN Take 1 tablet (200 mg total) by mouth daily for 9 days.   guaiFENesin 600 MG 12 hr tablet Commonly known as:  MUCINEX Take 1 tablet (600 mg total) by mouth 2 (two) times daily.   hydrocortisone cream 1 % Apply to affected area 2 times daily   ibuprofen 200 MG tablet Commonly known as:  ADVIL,MOTRIN Take 400 mg by mouth every 6 (six) hours as needed for moderate pain.   sulfamethoxazole-trimethoprim 800-160 MG tablet Commonly known as:  BACTRIM DS,SEPTRA DS Take 2 tablets by mouth every 8 (eight) hours for 18 days.      Allergies  Allergen Reactions  . Penicillins     Childhood allergy, doesn't know   Discharge Instructions    Diet - low sodium heart healthy   Complete by:  As directed    Discharge instructions   Complete by:  As directed    It is important that you read following instructions as well as go over your medication list with RN to help you understand your care after this hospitalization.  Discharge  Instructions: Please follow-up with PCP in one week  Please request your primary care physician to go over all Hospital Tests and Procedure/Radiological results at the follow up,  Please get all Hospital records sent to your PCP by signing hospital release before you go home.   Do not take more than prescribed Pain, Sleep and Anxiety Medications. You were cared for by a hospitalist during your hospital stay. If you have any questions about your discharge medications or the care you received while you were in the hospital after you are discharged, you can call the unit and ask to speak with the hospitalist on call if the hospitalist that took care of you is not available.  Once you are discharged, your primary care physician will handle any further medical issues. Please note that NO REFILLS for any discharge medications will be authorized once you are discharged, as it is imperative that you return to your primary care physician (or establish a relationship with a primary care physician if you do not have one) for your aftercare needs so that they can reassess your need for medications and  monitor your lab values. You Must read complete instructions/literature along with all the possible adverse reactions/side effects for all the Medicines you take and that have been prescribed to you. Take any new Medicines after you have completely understood and accept all the possible adverse reactions/side effects. Wear Seat belts while driving. If you have smoked or chewed Tobacco in the last 2 yrs please stop smoking and/or stop any Recreational drug use.   Increase activity slowly   Complete by:  As directed      Discharge Exam: Filed Weights   02/18/18 0619  Weight: 72.6 kg (160 lb)   Vitals:   02/21/18 2042 02/22/18 0531  BP: 125/69 133/74  Pulse: 90 80  Resp: 16 16  Temp: 98.8 F (37.1 C) 98.6 F (37 C)  SpO2: 97% 100%   General: Appear in no distress, no Rash; Oral Mucosa  moist. Cardiovascular: S1 and S2 Present, no Murmur, no JVD Respiratory: Bilateral Air entry present and Clear to Auscultation, no Crackles, no wheezes Abdomen: Bowel Sound present, Soft and no tenderness Extremities: no Pedal edema, no calf tenderness Neurology: Grossly no focal neuro deficit.  The results of significant diagnostics from this hospitalization (including imaging, microbiology, ancillary and laboratory) are listed below for reference.    Significant Diagnostic Studies: Dg Chest 2 View  Result Date: 02/18/2018 CLINICAL DATA:  Difficulty breathing for 2 days. Cough. Started antibiotics yesterday and feels worse. EXAM: CHEST - 2 VIEW COMPARISON:  None. FINDINGS: Normal heart size and pulmonary vascularity. Peribronchial thickening suggesting airways disease or bronchiolitis. No focal consolidation. No blunting of costophrenic angles. No pneumothorax. Mediastinal contours appear intact. IMPRESSION: Peribronchial thickening suggesting airways disease or bronchiolitis. Electronically Signed   By: Burman Nieves M.D.   On: 02/18/2018 06:47    Microbiology: Recent Results (from the past 240 hour(s))  Culture, blood (routine x 2)     Status: None   Collection Time: 02/19/18  4:15 PM  Result Value Ref Range Status   Specimen Description BLOOD RIGHT ARM  Final   Special Requests   Final    BOTTLES DRAWN AEROBIC AND ANAEROBIC Blood Culture adequate volume Performed at [facility], [address], [place], [place] [address]    Culture   Final    NO GROWTH 5 DAYS Performed at [facility], [address], [place], [place] [address]    Report Status 02/24/2018 FINAL  Final  Culture, blood (routine x 2)     Status: None   Collection Time: 02/19/18  4:21 PM  Result Value Ref Range Status   Specimen Description BLOOD RIGHT HAND  Final   Special Requests   Final    BOTTLES DRAWN AEROBIC AND ANAEROBIC Blood Culture adequate volume Performed at [facility], [address] [place], [place] [address]    Culture   Final    NO GROWTH 5 DAYS Performed at [facility], [address] [place], [place] [address]    Report Status 02/24/2018 FINAL  Final     Labs: CBC: Recent Labs  Lab 02/19/18 0526 02/20/18 0521  WBC 9.2 6.7  NEUTROABS  --  5.0  HGB 13.2 13.5  HCT 37.5* 38.4*  MCV 82.4 82.1  PLT 325 347   Basic Metabolic Panel: Recent Labs  Lab 02/19/18 0526 02/20/18 0521  NA 133* 134*  K 4.0 4.0  CL 100 98  CO2 22 26  GLUCOSE 115* 106*  BUN 7 6  CREATININE 0.74  0.73  CALCIUM 8.9 9.4  MG  --  2.1   Liver Function Tests: Recent Labs  Lab 02/19/18 0526 02/20/18 0521  AST 17 20  ALT 11 12  ALKPHOS 72 74  BILITOT 0.9 0.8  PROT 7.7 8.4*  ALBUMIN 2.9* 3.1*   Time spent: 35 minutes  Signed:  Lynden OxfordPranav Renella Steig  [facility] 02/22/2018 , 6:22 PM

## 2018-02-28 LAB — HLA B*5701: HLA B 5701: NEGATIVE

## 2018-03-08 ENCOUNTER — Telehealth: Payer: Self-pay | Admitting: *Deleted

## 2018-03-08 NOTE — Telephone Encounter (Addendum)
Referral received from Dr Ninetta LightsHatcher and contact made today. Jesse Andrews states he has been doing well since his last hospitalization and has completed his antibiotics. He continues to take his Biktarvy daily and has returned back to work.   Initial Assessment Points to Consider for Care  Points are not all inclusive to services and educated provided.  . Is Home safe for visits? Offered but patient has not agreed to my services at this time .  Are all firearms or weapons secure? Not addressed at this time . Insurance Coverage: discussed and patient stated he will not be able to cover the cost of his medications when refills are due (08/09) Appt reminder given at this time(07/31) and advised to bring a copy of his paystubs so we can find the best program to assist him with medication cost. Spoke with Pharmacy also(Cassie) and she stated at his last refill we billed [employer] and only assisted with copay cost. Cassie will further assist his needs during his visit on the 31st. Pharmacy visit scheduled for the 31st as well to address medication concerns and regimen needs . 1st HIV Diagnosis: 2015 . High risk behaviors: MSM  . Functional Status: none noted  . Current Housing/Needs: not a concern at this time . Social Support/System: not asked at this time . Culture/Religion/Spirituality: not asked at this time . Educational Background: college education . Legal Issues: none pending . Access/Utilization of Community Resources: personal vehicle . Mental Health Concerns/Diagnosis: none noted . Alcohol and/or Drug Use: occasional marijuana use . Risk and Knowledge of HIV and Reduction in Transmission: unable to fully assess during this conversation . Nutritional Needs: none noted

## 2018-03-15 ENCOUNTER — Telehealth: Payer: Self-pay | Admitting: *Deleted

## 2018-03-15 NOTE — Telephone Encounter (Signed)
Message sent today to Hani via text stating "Hello Cailen, its Johnrobert Foti. Hoping all is well and we look forward to seeing you tomorrow at 11:15. Please let me know if I can help in anyway.

## 2018-03-16 ENCOUNTER — Ambulatory Visit: Payer: Self-pay

## 2018-03-16 ENCOUNTER — Encounter: Payer: Self-pay | Admitting: Infectious Diseases

## 2018-03-16 ENCOUNTER — Ambulatory Visit (INDEPENDENT_AMBULATORY_CARE_PROVIDER_SITE_OTHER): Payer: Self-pay | Admitting: Infectious Diseases

## 2018-03-16 DIAGNOSIS — B370 Candidal stomatitis: Secondary | ICD-10-CM

## 2018-03-16 DIAGNOSIS — B20 Human immunodeficiency virus [HIV] disease: Secondary | ICD-10-CM

## 2018-03-16 DIAGNOSIS — B59 Pneumocystosis: Secondary | ICD-10-CM

## 2018-03-16 NOTE — Assessment & Plan Note (Signed)
Spoke at length with pt and partner- spoke at length about transmission, undetectable, prep.  Will have pharmacy meet with them Offered/refused condoms.  Will recheck lab today.  rtc in 6 weeks.

## 2018-03-16 NOTE — Assessment & Plan Note (Signed)
Resolved

## 2018-03-16 NOTE — Assessment & Plan Note (Signed)
Appears to be resolved. 

## 2018-03-16 NOTE — Progress Notes (Signed)
   Subjective:    Patient ID: Jesse Andrews, male    DOB: September 16, 1995, 22 y.o.   MRN: [id number]  HPI 22 y.o. male with hx of HIV+ in 2015 which he denies knowledge of. He was adm 02-18-18 with 4 days of fever then 2 days of SOB and cough. Non-productive. He was treated as PCP (bactrim, no steroids) and was started on biktravy. He was d/c home on the 9th.   Has been feeling better.  No SOB or or cough. Has finished all the rx's except biktarvy.  Wt has gone back to his baseline of 164.  His partner is here, they were having unprotected sex 3-4 years.   HIV 1 RNA Quant (copies/mL)  Date Value  02/18/2018 147,000  07/09/2014 12,417 (H)   CD4 T Cell Abs (/uL)  Date Value  02/18/2018 10 (L)  07/09/2014 480    Review of Systems  Constitutional: Negative for appetite change and unexpected weight change.  HENT: Negative for trouble swallowing.   Respiratory: Negative for cough and shortness of breath.   Gastrointestinal: Negative for constipation and diarrhea.  Genitourinary: Negative for difficulty urinating.  Please see HPI. All other systems reviewed and negative.      Objective:   Physical Exam  Constitutional: He appears well-developed and well-nourished.  HENT:  Head: Normocephalic and atraumatic.  Mouth/Throat: No oropharyngeal exudate.  Eyes: Pupils are equal, round, and reactive to light. EOM are normal.  Neck: Normal range of motion. Neck supple.  Cardiovascular: Normal rate, regular rhythm and normal heart sounds.  Pulmonary/Chest: Effort normal and breath sounds normal.  Abdominal: Soft. Bowel sounds are normal. He exhibits no distension. There is no tenderness.  Musculoskeletal: Normal range of motion. He exhibits no edema.  Lymphadenopathy:    He has no cervical adenopathy.  Skin: Skin is warm and dry.  Psychiatric: He has a normal mood and affect.       Assessment & Plan:

## 2018-03-18 ENCOUNTER — Encounter: Payer: Self-pay | Admitting: *Deleted

## 2018-03-18 LAB — HIV GENOSURE(R) MG

## 2018-03-18 LAB — REFLEX TO GENOSURE(R) MG EDI: HIV GenoSure(R): 1

## 2018-03-18 LAB — HIV-1 RNA, PCR (GRAPH) RFX/GENO EDI
HIV-1 RNA BY PCR: 131000 {copies}/mL
HIV-1 RNA QUANT, LOG: 5.117 {Log_copies}/mL

## 2018-03-22 LAB — HIV-1 RNA ULTRAQUANT REFLEX TO GENTYP+
HIV 1 RNA QUANT: 65 {copies}/mL — AB
HIV-1 RNA Quant, Log: 1.81 Log copies/mL — ABNORMAL HIGH

## 2018-03-24 ENCOUNTER — Telehealth: Payer: Self-pay | Admitting: Pharmacy Technician

## 2018-03-24 ENCOUNTER — Ambulatory Visit: Payer: Self-pay | Admitting: *Deleted

## 2018-03-24 ENCOUNTER — Telehealth: Payer: Self-pay | Admitting: *Deleted

## 2018-03-24 ENCOUNTER — Telehealth: Payer: Self-pay | Admitting: Pharmacist

## 2018-03-24 DIAGNOSIS — B20 Human immunodeficiency virus [HIV] disease: Secondary | ICD-10-CM

## 2018-03-24 MED FILL — BIKTARVY 50-200-25 MG TABS: 50-200-25 | 30 days supply | Qty: 30 | Fill #1

## 2018-03-24 NOTE — Telephone Encounter (Signed)
Received a call from Jalene stating he tried to get his medications but the cost is over 3,000. He states currently he does not have any insurance since he must be employed at his current job for over 90 days before he can get insurance. He stated when he came for his appt he could not wait for 2hrs to see the pharmacist because he had to return to work. When scheduling the appt Cassie(Pharmacist) discussed just placing the patient on her schedule and she would work him in around the same time as his provider appt so the patient would not have to wait. I apologized to Almon for the confusion and my failure with delivering that information to him.   Deaire stated he took his last pill(Biktarvy) today and apologized for waiting to the last minute. I thanked him for calling me and offered to bring by a 14 day supply of medications until we can get his medications covered. Kahmari was so thankful and gave me a address for medications to be delivered.

## 2018-03-24 NOTE — Telephone Encounter (Signed)
Received word from [facility] and [facility] that patient's insurance has terminated. He just started a new job and his new insurance will not kick in for 90 days.  I was able to get him advancing access through [employer] for an immediate 30 day supply.  [facility] and Molli Poseymbre will pick it up for him today.  He will most likely be approved until his insurance kicks in.

## 2018-03-24 NOTE — Telephone Encounter (Signed)
[facility] pharmacy has called multiple times to refill Mr. Jesse Andrews's HIV medication.  There is a busy signal each time.

## 2018-03-25 NOTE — Telephone Encounter (Signed)
Patient approved to get Biktarvy through [name] patient assistance until 03/24/19.

## 2018-03-31 NOTE — Telephone Encounter (Signed)
Medications were delivered to the patient without any problems or concerns noted.

## 2018-04-19 ENCOUNTER — Ambulatory Visit: Payer: Self-pay | Admitting: Infectious Diseases

## 2018-04-25 ENCOUNTER — Telehealth: Payer: Self-pay | Admitting: *Deleted

## 2018-04-25 NOTE — Telephone Encounter (Signed)
On 03/24/18 I was able to take Jesse Andrews a supply of his Biktarvy and at that time he stated he has 1 pill left. Contacted [name] via text to ensure he was able to get his medications refilled.

## 2018-04-26 MED FILL — BIKTARVY 50-200-25 MG TABS: 50-200-25 | 30 days supply | Qty: 30 | Fill #2

## 2018-05-03 NOTE — Progress Notes (Signed)
Received a text message from Jesse Andrews stating he was not able to get his medication(Biktarvy). Today I was able to coordinate with [facility] pharmacy on getting a 30 day supply of biktarvy. Currently Jesse Andrews is at work but we agreed on a location where I can drop him medications off.  [facility]. Delivered medications and followed up with Jesse Andrews who stated he will pick the medications up ASAP. Pleased to be able to assist Jesse Andrews and will plan to f/u next month to ensure he can get his medications.

## 2018-05-20 MED FILL — BIKTARVY 50-200-25 MG TABS: 50-200-25 | 30 days supply | Qty: 30 | Fill #3

## 2018-06-13 ENCOUNTER — Other Ambulatory Visit: Payer: Self-pay | Admitting: Infectious Diseases

## 2018-06-15 MED FILL — BIKTARVY 50-200-25 MG TABS: 50-200-25 | 30 days supply | Qty: 30 | Fill #0

## 2018-07-04 ENCOUNTER — Telehealth: Payer: Self-pay | Admitting: *Deleted

## 2018-07-04 NOTE — Telephone Encounter (Signed)
Contacted Jesse Andrews today to stay connected with him and to offer assistance. Message left and at this time I have not received a reply. I would love for him to come over to the clinic for a visit. Last refill request on 06/14/18 with approval from [facility] with 3 refills

## 2018-07-26 ENCOUNTER — Telehealth: Payer: Self-pay | Admitting: *Deleted

## 2018-07-26 NOTE — Telephone Encounter (Signed)
Received a text from Jesse Andrews. He would like to come in for any appt with the provider.(last appt with Dr Ninetta LightsHatcher on July 31st 2019) He has an appt scheduled for Jan 2nd 2020 with Dr Ninetta LightsHatcher. Jesse. Jesse Andrews is interested in seeing a provider earlier than the 2nd so I offered him an appt with the NP for the 12th at 2:45 or 4pm. I am waiting on a confirmation via text from Jesse Andrews before changing the appt.

## 2018-08-18 ENCOUNTER — Ambulatory Visit: Payer: Self-pay | Admitting: Infectious Diseases

## 2018-08-18 ENCOUNTER — Telehealth: Payer: Self-pay | Admitting: *Deleted

## 2018-08-18 NOTE — Telephone Encounter (Signed)
Patient no-showed Dr Ninetta Lights. Will send to [facility]. Andree Coss, RN

## 2018-08-19 NOTE — Telephone Encounter (Signed)
Man!! He actually asked for the appt if  I am not mistaken. He may not agree to bridge but it's worth a try. He has access to the resources, works a full time job but struggles with his diagnosis. 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  He will only speak to me sporadically over the phone or text. Keeping my fingers crossed for him!

## 2018-08-22 MED FILL — BIKTARVY 50-200-25 MG TABS: 50-200-25 | 30 days supply | Qty: 30 | Fill #1

## 2018-08-31 ENCOUNTER — Ambulatory Visit: Payer: 59

## 2018-09-01 ENCOUNTER — Ambulatory Visit: Payer: 59 | Admitting: Infectious Diseases

## 2018-09-01 ENCOUNTER — Ambulatory Visit: Payer: 59

## 2018-09-09 ENCOUNTER — Ambulatory Visit (INDEPENDENT_AMBULATORY_CARE_PROVIDER_SITE_OTHER): Payer: 59 | Admitting: Infectious Diseases

## 2018-09-09 ENCOUNTER — Other Ambulatory Visit (HOSPITAL_COMMUNITY)
Admission: RE | Admit: 2018-09-09 | Discharge: 2018-09-09 | Disposition: A | Discharge status: Home / Self Care | Payer: 59 | Source: Ambulatory Visit | Attending: Infectious Diseases | Admitting: Infectious Diseases

## 2018-09-09 ENCOUNTER — Encounter: Payer: Self-pay | Admitting: Infectious Diseases

## 2018-09-09 VITALS — BP 128/82 | HR 93 | Temp 98.3°F | Wt 131.0 lb

## 2018-09-09 DIAGNOSIS — B20 Human immunodeficiency virus [HIV] disease: Secondary | ICD-10-CM | POA: Diagnosis not present

## 2018-09-09 DIAGNOSIS — Z79899 Other long term (current) drug therapy: Secondary | ICD-10-CM

## 2018-09-09 DIAGNOSIS — Z23 Encounter for immunization: Secondary | ICD-10-CM

## 2018-09-09 DIAGNOSIS — B370 Candidal stomatitis: Secondary | ICD-10-CM | POA: Diagnosis not present

## 2018-09-09 DIAGNOSIS — Z113 Encounter for screening for infections with a predominantly sexual mode of transmission: Secondary | ICD-10-CM

## 2018-09-09 LAB — T-HELPER CELL (CD4) - (RCID CLINIC ONLY)
CD4 % Helper T Cell: 12 % — ABNORMAL LOW (ref 33–55)
CD4 T Cell Abs: 230 /uL — ABNORMAL LOW (ref 400–2700)

## 2018-09-09 NOTE — Assessment & Plan Note (Signed)
Will check his labs Flu vax today Hep A/B today Labs today Given condoms rtc in 3 months

## 2018-09-09 NOTE — Assessment & Plan Note (Signed)
Will mark as resolved.  

## 2018-09-09 NOTE — Addendum Note (Signed)
Addended by: Gerarda Fraction on: 09/09/2018 11:55 AM   Modules accepted: Orders

## 2018-09-09 NOTE — Progress Notes (Signed)
   Subjective:    Patient ID: Jesse Andrews, male    DOB: March 20, 1996, 23 y.o.   MRN: [id number]  HPI 23 y.o.malewith hx of HIV+ in 2015 which he denies knowledge of. He was adm 02-18-18 with 4 days of fever then 2 days of SOB and cough. Non-productive. He was treated as PCP (bactrim, no steroids) and was started on biktravy. No problems with ART. On no OI rx.    HIV 1 RNA Quant (copies/mL)  Date Value  03/16/2018 65 (H)  02/18/2018 147,000  07/09/2014 12,417 (H)   CD4 T Cell Abs (/uL)  Date Value  02/18/2018 10 (L)  07/09/2014 480    Review of Systems  Constitutional: Positive for unexpected weight change. Negative for appetite change.  HENT: Negative for mouth sores.   Respiratory: Negative for cough and shortness of breath.   Gastrointestinal: Negative for constipation and diarrhea.  Genitourinary: Negative for difficulty urinating.  Please see HPI. All other systems reviewed and negative.      Objective:   Physical Exam Constitutional:      Appearance: Normal appearance.  HENT:     Mouth/Throat:     Mouth: Mucous membranes are moist.     Pharynx: No oropharyngeal exudate.  Eyes:     Extraocular Movements: Extraocular movements intact.     Pupils: Pupils are equal, round, and reactive to light.  Neck:     Musculoskeletal: Normal range of motion and neck supple.  Cardiovascular:     Rate and Rhythm: Normal rate and regular rhythm.  Pulmonary:     Effort: Pulmonary effort is normal.     Breath sounds: Normal breath sounds.  Abdominal:     General: Bowel sounds are normal. There is no distension.     Palpations: Abdomen is soft.     Tenderness: There is no abdominal tenderness.  Musculoskeletal:        General: No swelling.  Neurological:     General: No focal deficit present.     Mental Status: He is alert.  Psychiatric:        Mood and Affect: Mood normal.       Assessment & Plan:

## 2018-09-12 LAB — URINE CYTOLOGY ANCILLARY ONLY
Chlamydia: NEGATIVE
NEISSERIA GONORRHEA: NEGATIVE

## 2018-09-13 LAB — LIPID PANEL
CHOL/HDL RATIO: 4.1 (calc) (ref <5.0)
Cholesterol: 151 mg/dL (ref <200)
HDL: 37 mg/dL — AB (ref >40)
LDL Cholesterol (Calc): 98 mg/dL (calc)
Non-HDL Cholesterol (Calc): 114 mg/dL (calc) (ref <130)
Triglycerides: 75 mg/dL (ref <150)

## 2018-09-13 LAB — COMPREHENSIVE METABOLIC PANEL
AG Ratio: 1.3 (calc) (ref 1.0–2.5)
ALKALINE PHOSPHATASE (APISO): 67 U/L (ref 40–115)
ALT: 14 U/L (ref 9–46)
AST: 17 U/L (ref 10–40)
Albumin: 4.5 g/dL (ref 3.6–5.1)
BUN: 13 mg/dL (ref 7–25)
CALCIUM: 10.1 mg/dL (ref 8.6–10.3)
CHLORIDE: 103 mmol/L (ref 98–110)
CO2: 31 mmol/L (ref 20–32)
CREATININE: 1.07 mg/dL (ref 0.60–1.35)
GLOBULIN: 3.6 g/dL (ref 1.9–3.7)
Glucose, Bld: 88 mg/dL (ref 65–99)
Potassium: 4.1 mmol/L (ref 3.5–5.3)
Sodium: 142 mmol/L (ref 135–146)
Total Bilirubin: 1.8 mg/dL — ABNORMAL HIGH (ref 0.2–1.2)
Total Protein: 8.1 g/dL (ref 6.1–8.1)

## 2018-09-13 LAB — CBC
HCT: 45.1 % (ref 38.5–50.0)
Hemoglobin: 15.3 g/dL (ref 13.2–17.1)
MCH: 30.2 pg (ref 27.0–33.0)
MCHC: 33.9 g/dL (ref 32.0–36.0)
MCV: 89.1 fL (ref 80.0–100.0)
MPV: 9.3 fL (ref 7.5–12.5)
PLATELETS: 240 10*3/uL (ref 140–400)
RBC: 5.06 10*6/uL (ref 4.20–5.80)
RDW: 13.1 % (ref 11.0–15.0)
WBC: 3.2 10*3/uL — ABNORMAL LOW (ref 3.8–10.8)

## 2018-09-13 LAB — HIV-1 RNA QUANT-NO REFLEX-BLD
HIV 1 RNA QUANT: NOT DETECTED {copies}/mL
HIV-1 RNA QUANT, LOG: NOT DETECTED {Log_copies}/mL

## 2018-09-13 LAB — FLUORESCENT TREPONEMAL AB(FTA)-IGG-BLD: Fluorescent Treponemal ABS: REACTIVE — AB

## 2018-09-13 LAB — RPR TITER: RPR Titer: 1:2 {titer} — ABNORMAL HIGH

## 2018-09-13 LAB — RPR: RPR: REACTIVE — AB

## 2018-09-22 MED FILL — BIKTARVY 50-200-25 MG TABS: 50-200-25 | 30 days supply | Qty: 30 | Fill #2

## 2018-11-04 MED FILL — BIKTARVY 50-200-25 MG TABS: 50-200-25 | 30 days supply | Qty: 30 | Fill #3

## 2018-12-06 ENCOUNTER — Other Ambulatory Visit: Payer: Self-pay | Admitting: Infectious Diseases

## 2018-12-07 MED FILL — BIKTARVY 50-200-25 MG TABS: 50-200-25 | 30 days supply | Qty: 30 | Fill #0

## 2019-01-05 MED FILL — BIKTARVY 50-200-25 MG TABS: 50-200-25 | 30 days supply | Qty: 30 | Fill #1

## 2019-02-01 MED FILL — BIKTARVY 50-200-25 MG TABS: 50-200-25 | 30 days supply | Qty: 30 | Fill #2

## 2019-03-14 MED FILL — BIKTARVY 50-200-25 MG TABS: 50-200-25 | 30 days supply | Qty: 30 | Fill #3

## 2019-03-16 ENCOUNTER — Ambulatory Visit: Payer: No Typology Code available for payment source

## 2019-04-10 ENCOUNTER — Telehealth: Payer: Self-pay | Admitting: Pharmacy Technician

## 2019-04-10 NOTE — Telephone Encounter (Signed)
[facility] Patient Advocate Encounter  Received notification today that the [name] was having trouble refilling the patient's medication due to insurance coverage. The [name] Advancing Access program expired for the patient on 03/25/2019. He is not eligible for renewal into that program until he has an adap denial letter. He was a no show to his 03/16/2019 financial appointment to apply. Unable to get the insurance on file to process properly, states he has [name] coverage. Left a voicemail for the patient to call us to share this updated information pertaining to his refill for August.

## 2019-04-12 ENCOUNTER — Other Ambulatory Visit: Payer: Self-pay | Admitting: Pharmacist

## 2019-04-12 DIAGNOSIS — B20 Human immunodeficiency virus [HIV] disease: Secondary | ICD-10-CM

## 2019-04-12 MED ORDER — BIKTARVY 50-200-25 MG PO TABS: 1.0000 | ORAL_TABLET | Freq: Every day | ORAL | 1 refills | Status: DC

## 2019-04-12 NOTE — Progress Notes (Signed)
Transferring Biktarvy to [facility] per insurance requirement.

## 2019-04-12 NOTE — Telephone Encounter (Signed)
Spoke with patient this afternoon and informed him that his new insurance requires [employer]. I provided the patient with their phone number so he could set up care and shipping of his [place]. Told the patient to call if he ran into any problems getting his medication or needed a copayment card to cover any cost.

## 2019-06-21 IMAGING — DX DG CHEST 2V
2 series · 2 of 2 positions shown · non-contrast
Comparison: None.

CLINICAL DATA: Difficulty breathing for 2 days. Cough. Started
antibiotics yesterday and feels worse.

EXAM:
CHEST - 2 VIEW

[chest pa]
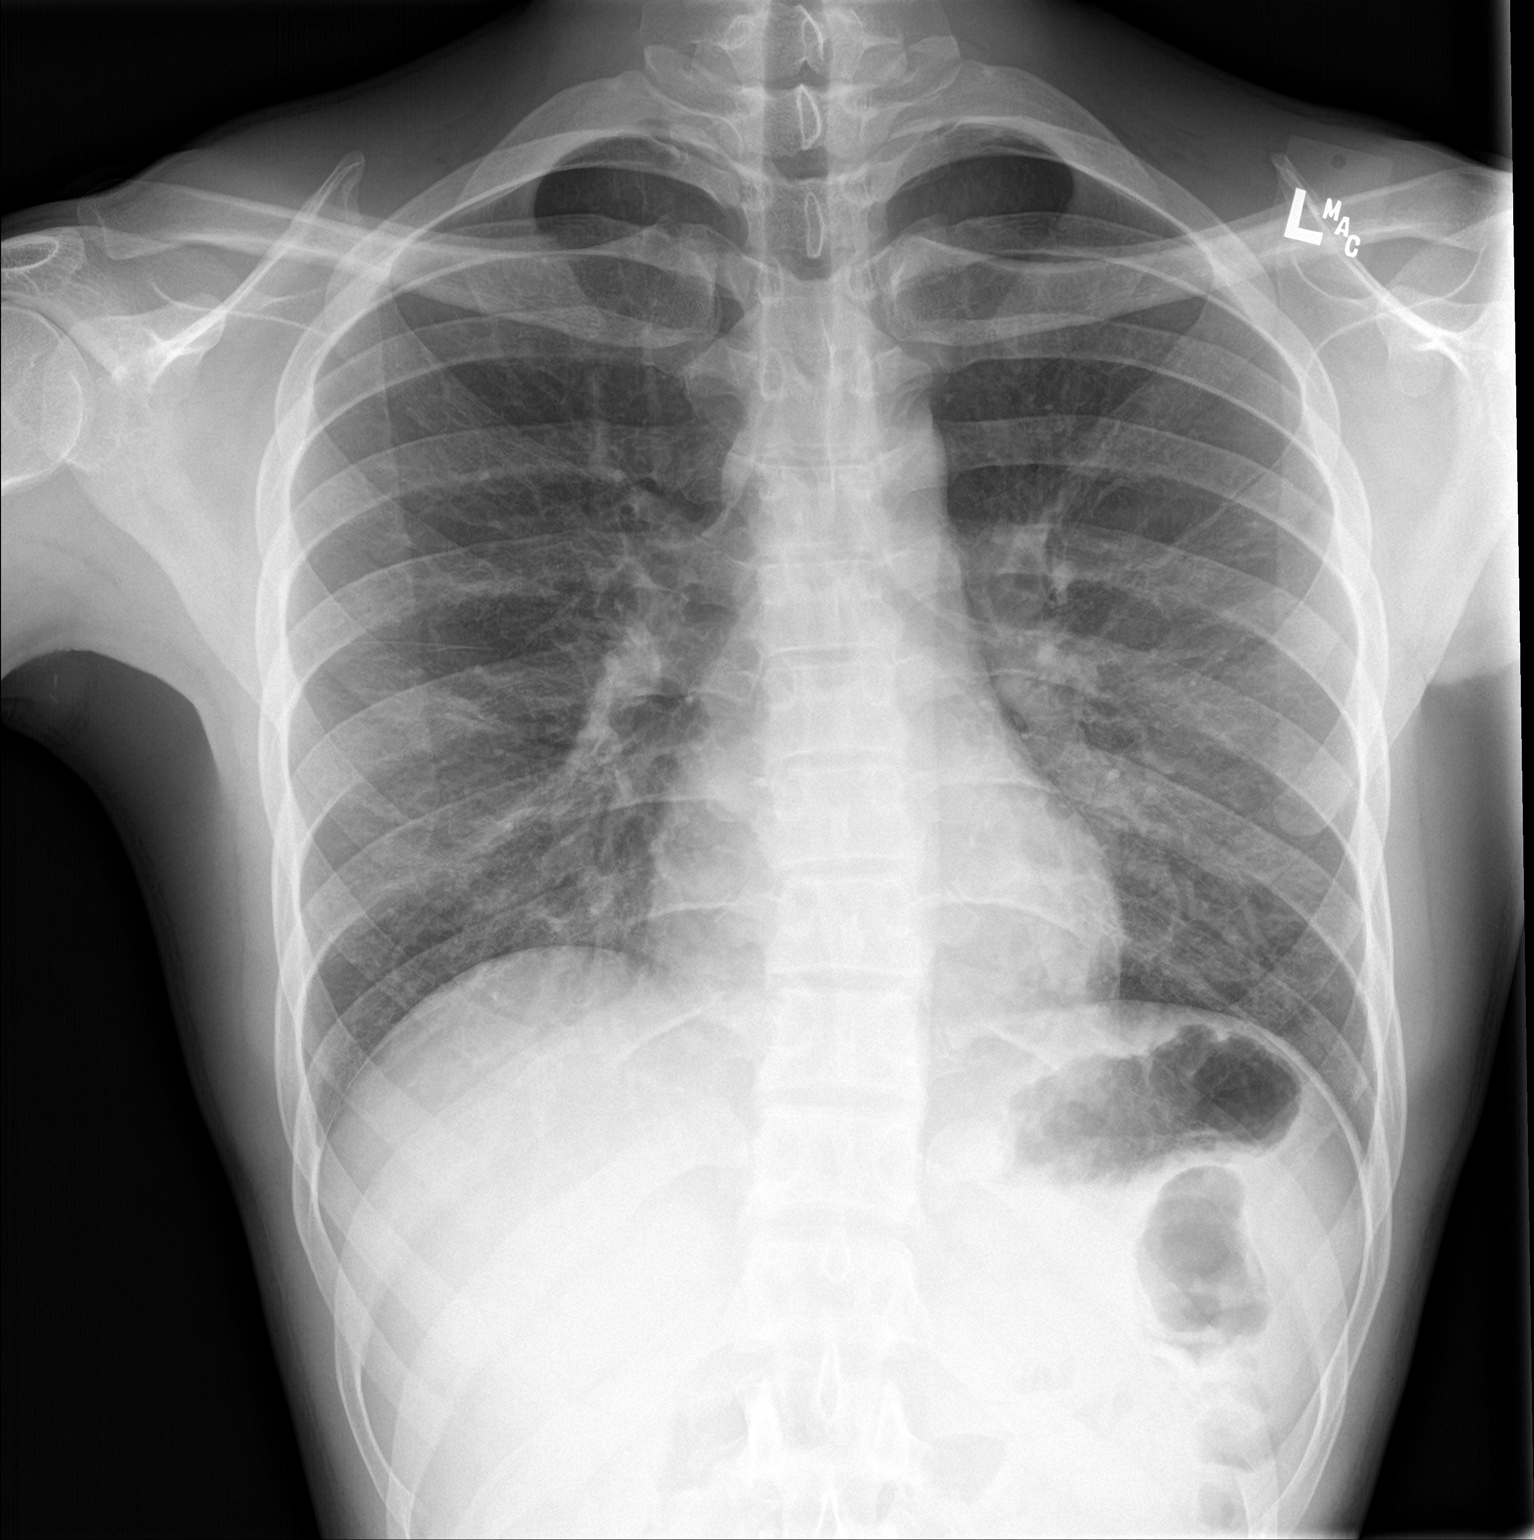

[chest lat]
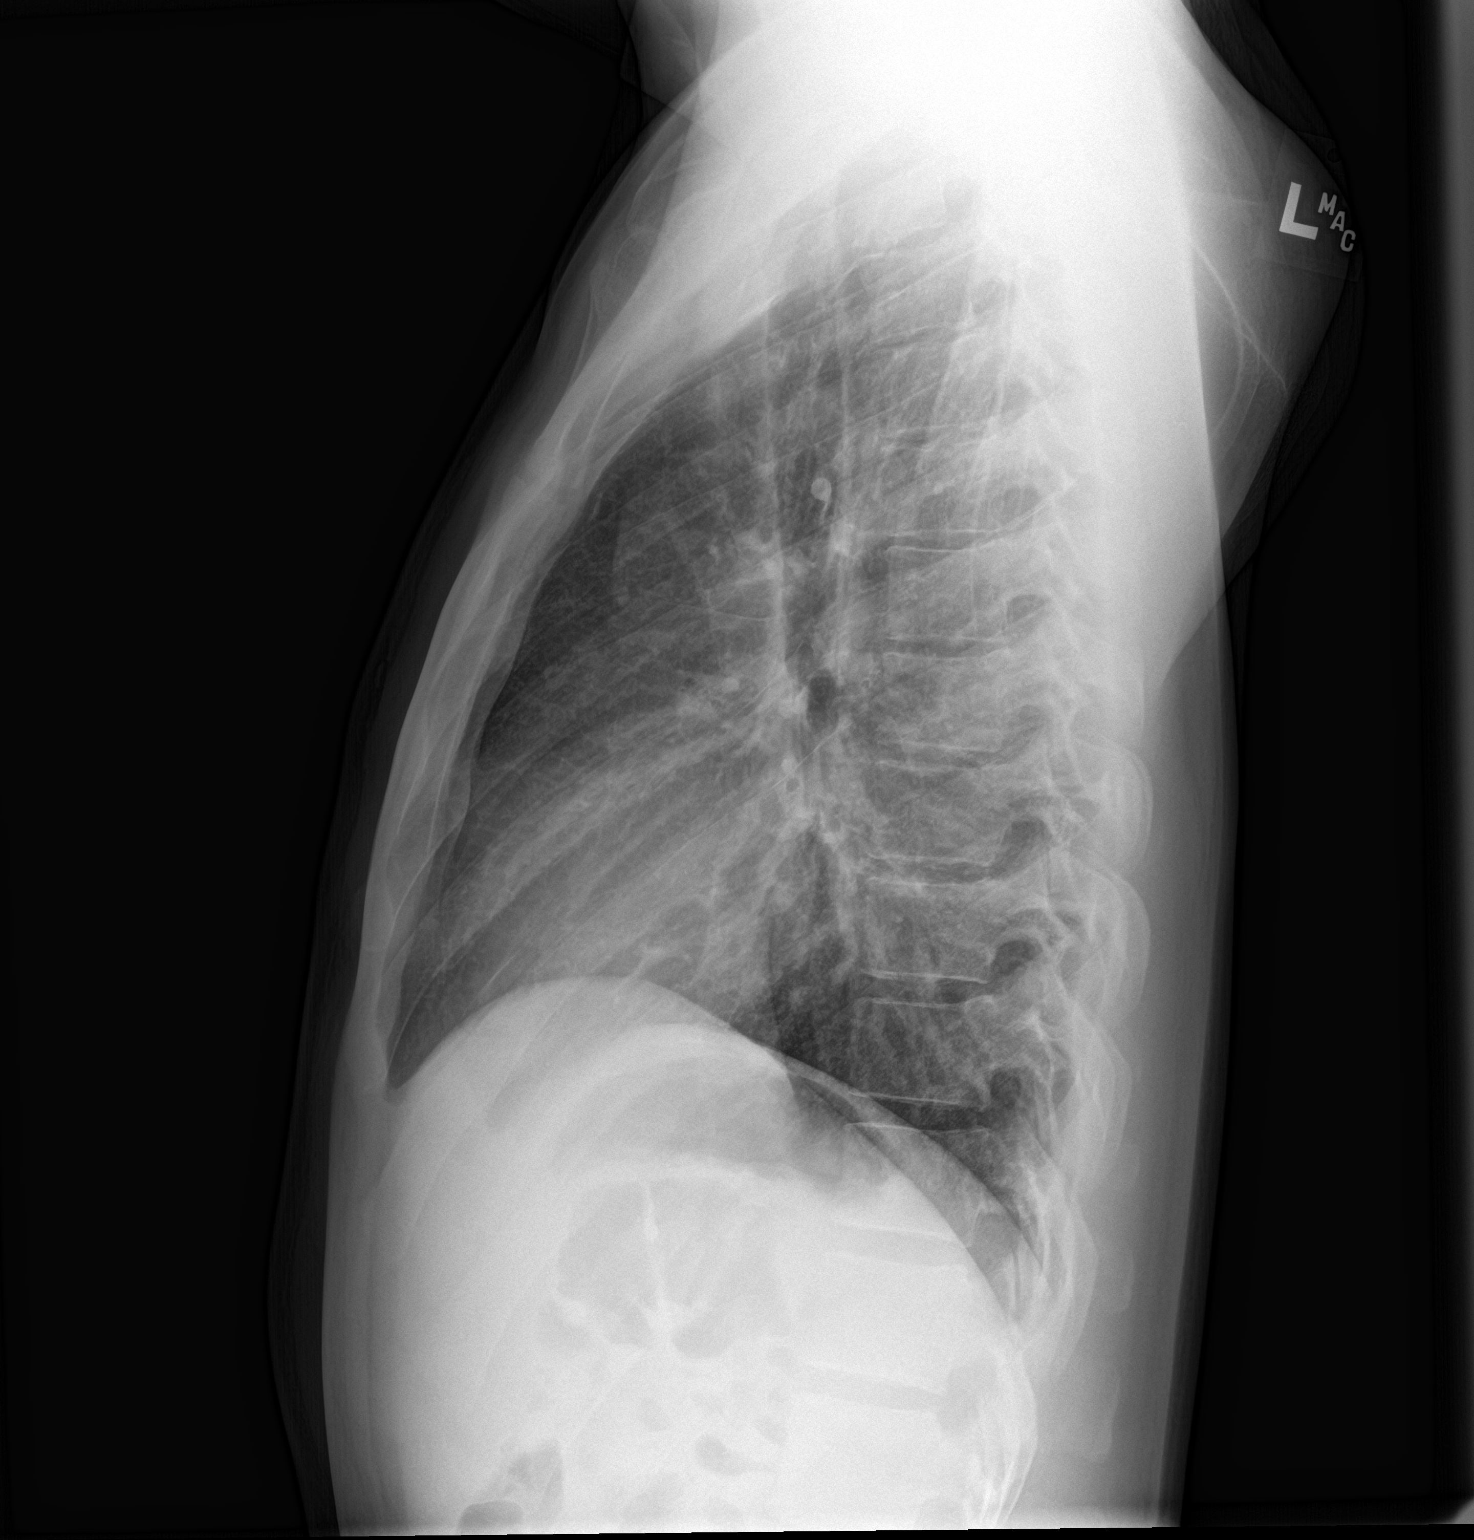

[2 of 2 positions shown; findings below may reference images not displayed]

FINDINGS: Normal heart size and pulmonary vascularity. Peribronchial
thickening suggesting airways disease or bronchiolitis. No focal
consolidation. No blunting of costophrenic angles. No pneumothorax.
Mediastinal contours appear intact.
IMPRESSION: Peribronchial thickening suggesting airways disease or
bronchiolitis.

## 2019-07-11 NOTE — Telephone Encounter (Signed)
[facility] Patient Advocate Encounter   Was successful in obtaining a [place] copay card for [employer]. This copay card will make the patients copay $0. The patient now has active Chief Executive Officer.  I have spoken with the patient. He originally thought it was filled at [place] but they did not have anything filled for him or at any [facility] per the representative on the phone. He is locked into [facility] and they provided the number [phone] for the patient to call and set up shipment to arrive tomorrow.      The billing information is: RxBin: [id number] PCN: [id number] Member ID: [id number] Group ID: [id number]  Bartholomew Crews, [facility] Phone: [phone] Fax: [phone] 07/11/2019 11:10 AM

## 2019-09-17 ENCOUNTER — Other Ambulatory Visit: Payer: Self-pay | Admitting: Infectious Diseases

## 2019-09-17 DIAGNOSIS — B20 Human immunodeficiency virus [HIV] disease: Secondary | ICD-10-CM

## 2021-01-28 ENCOUNTER — Other Ambulatory Visit: Payer: Self-pay

## 2021-01-28 ENCOUNTER — Ambulatory Visit: Payer: Self-pay

## 2021-01-28 DIAGNOSIS — B20 Human immunodeficiency virus [HIV] disease: Secondary | ICD-10-CM

## 2021-01-28 DIAGNOSIS — Z79899 Other long term (current) drug therapy: Secondary | ICD-10-CM

## 2021-01-28 DIAGNOSIS — Z113 Encounter for screening for infections with a predominantly sexual mode of transmission: Secondary | ICD-10-CM

## 2021-01-29 LAB — T-HELPER CELL (CD4) - (RCID CLINIC ONLY)
CD4 % Helper T Cell: 3 % — ABNORMAL LOW (ref 33–65)
CD4 T Cell Abs: 36 /uL — ABNORMAL LOW (ref 400–1790)

## 2021-01-29 LAB — URINE CYTOLOGY ANCILLARY ONLY
Chlamydia: NEGATIVE
Comment: NEGATIVE
Comment: NORMAL
Neisseria Gonorrhea: NEGATIVE

## 2021-01-30 ENCOUNTER — Encounter: Payer: Self-pay | Admitting: Infectious Diseases

## 2021-01-30 LAB — COMPLETE METABOLIC PANEL WITH GFR
AG Ratio: 1.3 (calc) (ref 1.0–2.5)
ALT: 20 U/L (ref 9–46)
AST: 15 U/L (ref 10–40)
Albumin: 4.7 g/dL (ref 3.6–5.1)
Alkaline phosphatase (APISO): 78 U/L (ref 36–130)
BUN: 9 mg/dL (ref 7–25)
CO2: 29 mmol/L (ref 20–32)
Calcium: 9.9 mg/dL (ref 8.6–10.3)
Chloride: 103 mmol/L (ref 98–110)
Creat: 0.76 mg/dL (ref 0.60–1.35)
GFR, Est African American: 146 mL/min/{1.73_m2} (ref >=60)
GFR, Est Non African American: 126 mL/min/{1.73_m2} (ref >=60)
Globulin: 3.5 g/dL (calc) (ref 1.9–3.7)
Glucose, Bld: 90 mg/dL (ref 65–99)
Potassium: 4.4 mmol/L (ref 3.5–5.3)
Sodium: 138 mmol/L (ref 135–146)
Total Bilirubin: 1.1 mg/dL (ref 0.2–1.2)
Total Protein: 8.2 g/dL — ABNORMAL HIGH (ref 6.1–8.1)

## 2021-01-30 LAB — CBC WITH DIFFERENTIAL/PLATELET
Absolute Monocytes: 343 cells/uL (ref 200–950)
Basophils Absolute: 10 cells/uL (ref 0–200)
Basophils Relative: 0.3 %
Eosinophils Absolute: 10 cells/uL — ABNORMAL LOW (ref 15–500)
Eosinophils Relative: 0.3 %
HCT: 46.9 % (ref 38.5–50.0)
Hemoglobin: 15.7 g/dL (ref 13.2–17.1)
Lymphs Abs: 1323 cells/uL (ref 850–3900)
MCH: 29.9 pg (ref 27.0–33.0)
MCHC: 33.5 g/dL (ref 32.0–36.0)
MCV: 89.3 fL (ref 80.0–100.0)
MPV: 9.8 fL (ref 7.5–12.5)
Monocytes Relative: 10.4 %
Neutro Abs: 1614 cells/uL (ref 1500–7800)
Neutrophils Relative %: 48.9 %
Platelets: 204 10*3/uL (ref 140–400)
RBC: 5.25 10*6/uL (ref 4.20–5.80)
RDW: 13.5 % (ref 11.0–15.0)
Total Lymphocyte: 40.1 %
WBC: 3.3 10*3/uL — ABNORMAL LOW (ref 3.8–10.8)

## 2021-01-30 LAB — HIV-1 RNA QUANT-NO REFLEX-BLD
HIV 1 RNA Quant: 44300 Copies/mL — ABNORMAL HIGH
HIV-1 RNA Quant, Log: 4.65 Log cps/mL — ABNORMAL HIGH

## 2021-01-30 LAB — LIPID PANEL
Cholesterol: 167 mg/dL (ref <200)
HDL: 46 mg/dL (ref >=40)
LDL Cholesterol (Calc): 104 mg/dL (calc) — ABNORMAL HIGH
Non-HDL Cholesterol (Calc): 121 mg/dL (calc) (ref <130)
Total CHOL/HDL Ratio: 3.6 (calc) (ref <5.0)
Triglycerides: 77 mg/dL (ref <150)

## 2021-01-30 LAB — RPR: RPR Ser Ql: REACTIVE — AB

## 2021-01-30 LAB — RPR TITER: RPR Titer: 1:4 {titer} — ABNORMAL HIGH

## 2021-01-30 LAB — FLUORESCENT TREPONEMAL AB(FTA)-IGG-BLD: Fluorescent Treponemal ABS: REACTIVE — AB

## 2021-02-11 ENCOUNTER — Telehealth: Payer: Self-pay

## 2021-02-11 ENCOUNTER — Other Ambulatory Visit: Payer: Self-pay

## 2021-02-11 ENCOUNTER — Ambulatory Visit (INDEPENDENT_AMBULATORY_CARE_PROVIDER_SITE_OTHER): Payer: Self-pay | Admitting: Infectious Diseases

## 2021-02-11 VITALS — BP 116/60 | HR 66 | Temp 98.4°F | Resp 16 | Ht 75.0 in | Wt 179.5 lb

## 2021-02-11 DIAGNOSIS — Z23 Encounter for immunization: Secondary | ICD-10-CM

## 2021-02-11 DIAGNOSIS — B20 Human immunodeficiency virus [HIV] disease: Secondary | ICD-10-CM

## 2021-02-11 MED ORDER — BIKTARVY 50-200-25 MG PO TABS: 1.0000 | ORAL_TABLET | Freq: Every day | ORAL | 1 refills | Status: DC

## 2021-02-11 NOTE — Assessment & Plan Note (Addendum)
Will get him in with pharm for biktarvy Offered/refused condoms.  Will get him in with dental Dental referral placed today for [facility]. Information to schedule appointment completed today. HPV vax today PCV vax today.  rtc in 6 months.

## 2021-02-11 NOTE — Addendum Note (Signed)
Addended by: Clayborne Artist A on: 02/11/2021 04:09 PM   Modules accepted: Orders

## 2021-02-11 NOTE — Progress Notes (Signed)
Met with patient today to get him restarted on Biktarvy. He states that his old job was demanding making it impossible for him to get to the clinic for appointments. He has been off for about 2 years. He has Chesley Noon and would like for it to be mailed to his house from [place]. I gave him one week of samples to hold him over until he can have it shipped. Told him to call me if he needs more. He tolerated it well without any issues.   Horacio Werth L. Denton Derks, PharmD, BCIDP, AAHIVP, CPP Clinical Pharmacist Practitioner [facility] 02/11/2021, 3:52 PM

## 2021-02-11 NOTE — Telephone Encounter (Signed)
Medication Samples have been provided to the patient.  Drug name: Biktarvy        Strength: 50/200/25 mg       Qty: 7   LOT: CHSYSA   Exp.Date: 06/23  Dosing instructions: Take one tablet by mouth once daily  Nichlos Kunzler, CPhT [facility] Phone: [phone] Fax:  [phone]  

## 2021-02-11 NOTE — Addendum Note (Signed)
Addended by: Devyn Sheerin C on: 02/11/2021 03:42 PM   Modules accepted: Orders

## 2021-02-11 NOTE — Progress Notes (Signed)
  Subjective:    Patient ID: Jesse Andrews, male  DOB: Sep 01, 1995, 25 y.o.        MRN: [id number]   HPI 25 y.o. male with hx of HIV+ in 2015 which he denies knowledge of. He was adm 02-18-18 he was treated as PCP (bactrim, no steroids) and was started on biktravy. Has been off ART Has had issues with insurance, job change.  Needs dental f/u.  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   HIV 1 RNA Quant  Date Value  01/28/2021 44,300 Copies/mL (H)  09/09/2018 <20 NOT DETECTED copies/mL  03/16/2018 65 copies/mL (H)   CD4 T Cell Abs (/uL)  Date Value  01/28/2021 36 (L)  09/09/2018 230 (L)  02/18/2018 10 (L)     Health Maintenance  Topic Date Due  . Pneumococcal Vaccine 11-43 Years old (1 - PCV) Never done  . HPV VACCINES (1 - Risk male 3-dose series) Never done  . TETANUS/TDAP  Never done  . INFLUENZA VACCINE  03/17/2021  . Hepatitis C Screening  Completed  . HIV Screening  Completed      Review of Systems  Constitutional:  Negative for chills, fever and weight loss.  Eyes:  Negative for blurred vision.  Respiratory:  Negative for cough and shortness of breath.   Gastrointestinal:  Negative for constipation and diarrhea.  Genitourinary:  Negative for dysuria.  Psychiatric/Behavioral:  The patient has insomnia (variable).    Please see HPI. All other systems reviewed and negative.     Objective:  Physical Exam Vitals reviewed.  Constitutional:      General: He is not in acute distress.    Appearance: Normal appearance. He is obese. He is not ill-appearing, toxic-appearing or diaphoretic.  HENT:     Mouth/Throat:     Mouth: Mucous membranes are moist.     Pharynx: No oropharyngeal exudate.  Eyes:     Extraocular Movements: Extraocular movements intact.     Pupils: Pupils are equal, round, and reactive to light.  Cardiovascular:     Rate and Rhythm: Normal rate and regular rhythm.  Pulmonary:     Effort: Pulmonary effort is normal.     Breath sounds: Normal  breath sounds.  Abdominal:     General: Bowel sounds are normal. There is no distension.     Palpations: Abdomen is soft.     Tenderness: There is no abdominal tenderness.  Musculoskeletal:     Cervical back: Normal range of motion and neck supple.     Right lower leg: No edema.     Left lower leg: No edema.  Neurological:     General: No focal deficit present.     Mental Status: He is alert and oriented to person, place, and time.  Psychiatric:        Mood and Affect: Mood normal.          Assessment & Plan:

## 2021-06-19 ENCOUNTER — Telehealth: Payer: Self-pay | Admitting: Physician Assistant

## 2021-06-19 DIAGNOSIS — J111 Influenza due to unidentified influenza virus with other respiratory manifestations: Secondary | ICD-10-CM

## 2021-06-19 MED ORDER — OSELTAMIVIR PHOSPHATE 75 MG PO CAPS: 75.0000 mg | ORAL_CAPSULE | Freq: Two times a day (BID) | ORAL | 0 refills | Status: DC

## 2021-06-19 MED ORDER — BENZONATATE 100 MG PO CAPS: 100.0000 mg | ORAL_CAPSULE | Freq: Three times a day (TID) | ORAL | 0 refills | Status: DC | PRN

## 2021-06-19 MED ORDER — IPRATROPIUM BROMIDE 0.03 % NA SOLN: 2.0000 | Freq: Two times a day (BID) | NASAL | 12 refills | Status: DC

## 2021-06-19 NOTE — Progress Notes (Signed)
Virtual Visit Consent   Jesse Andrews, you are scheduled for a virtual visit with a [facility] provider today.     Just as with appointments in the office, your consent must be obtained to participate.  Your consent will be active for this visit and any virtual visit you may have with one of our providers in the next 365 days.     If you have a MyChart account, a copy of this consent can be sent to you electronically.  All virtual visits are billed to your insurance company just like a traditional visit in the office.    As this is a virtual visit, video technology does not allow for your provider to perform a traditional examination.  This may limit your provider's ability to fully assess your condition.  If your provider identifies any concerns that need to be evaluated in person or the need to arrange testing (such as labs, EKG, etc.), we will make arrangements to do so.     Although advances in technology are sophisticated, we cannot ensure that it will always work on either your end or our end.  If the connection with a video visit is poor, the visit may have to be switched to a telephone visit.  With either a video or telephone visit, we are not always able to ensure that we have a secure connection.     I need to obtain your verbal consent now.   Are you willing to proceed with your visit today?    Jesse Andrews has provided verbal consent on 06/19/2021 for a virtual visit (video or telephone).   Margaretann Loveless, PA-C   Date: 06/19/2021 9:08 AM   Virtual Visit via Video Note   I, Margaretann Loveless, connected with  Jesse Andrews  ([id number], 03/19/1996) on 06/19/21 at  8:30 AM EDT by a video-enabled telemedicine application and verified that I am speaking with the correct person using two identifiers.  Location: Patient: Virtual Visit Location Patient: Mobile Provider: Virtual Visit Location Provider: Home Office   I discussed the limitations of evaluation and  management by telemedicine and the availability of in person appointments. The patient expressed understanding and agreed to proceed.    History of Present Illness: Jesse Andrews is a 25 y.o. who identifies as a male who was assigned male at birth, and is being seen today for URI symptoms.  HPI: URI  This is a new problem. The current episode started yesterday. The problem has been gradually worsening. Associated symptoms include congestion, coughing, headaches, rhinorrhea, sinus pain and a sore throat. Associated symptoms comments: Hot and cold chills, body aches. He has tried nothing for the symptoms. The treatment provided no relief.  He is a Manufacturing systems engineer, multiple sick contacts.  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   Hypoxia 02/18/2018   HIV disease (HCC) 12/28/2013   Syphilis 12/28/2013   Gonorrhea 12/28/2013   Chlamydia infection 12/28/2013    Allergies:  Allergies  Allergen Reactions   Penicillin G    Penicillins     Childhood allergy, doesn't know   Medications:  Current Outpatient Medications:    benzonatate (TESSALON) 100 MG capsule, Take 1 capsule (100 mg total) by mouth 3 (three) times daily as needed., Disp: 30 capsule, Rfl: 0   ipratropium (ATROVENT) 0.03 % nasal spray, Place 2 sprays into both nostrils every 12 (twelve) hours., Disp:  30 mL, Rfl: 12   oseltamivir (TAMIFLU) 75 MG capsule, Take 1 capsule (75 mg total) by mouth 2 (two) times daily., Disp: 10 capsule, Rfl: 0   bictegravir-emtricitabine-tenofovir AF (BIKTARVY) 50-200-25 MG TABS tablet, Take 1 tablet by mouth daily., Disp: 30 tablet, Rfl: 1   cetirizine (ZYRTEC) 10 MG tablet, Take 1 tablet (10 mg total) by mouth daily. (Patient not taking: No sig reported), Disp: 30 tablet, Rfl: 0   guaiFENesin (MUCINEX) 600 MG 12 hr tablet, Take 1 tablet (600 mg total) by mouth 2 (two) times daily.  (Patient not taking: No sig reported), Disp: 30 tablet, Rfl: 0   hydrocortisone cream 1 %, Apply to affected area 2 times daily, Disp: 15 g, Rfl: 0   ibuprofen (ADVIL,MOTRIN) 200 MG tablet, Take 400 mg by mouth every 6 (six) hours as needed for moderate pain., Disp: , Rfl:   Observations/Objective: Patient is well-developed, well-nourished in no acute distress.  Resting comfortably  Head is normocephalic, atraumatic.  No labored breathing.  Speech is clear and coherent with logical content.  Patient is alert and oriented at baseline.    Assessment and Plan: 1. Influenza - oseltamivir (TAMIFLU) 75 MG capsule; Take 1 capsule (75 mg total) by mouth 2 (two) times daily.  Dispense: 10 capsule; Refill: 0 - benzonatate (TESSALON) 100 MG capsule; Take 1 capsule (100 mg total) by mouth 3 (three) times daily as needed.  Dispense: 30 capsule; Refill: 0 - ipratropium (ATROVENT) 0.03 % nasal spray; Place 2 sprays into both nostrils every 12 (twelve) hours.  Dispense: 30 mL; Refill: 12  - Suspect flu - Start Tamiflu today - Tessalon perles for cough - Atrovent nasal spray for nasal congestion and drainage - Push fluids - Rest - Work note provided - Seek in person evaluation if not improving or symptoms worsen.  Follow Up Instructions: I discussed the assessment and treatment plan with the patient. The patient was provided an opportunity to ask questions and all were answered. The patient agreed with the plan and demonstrated an understanding of the instructions.  A copy of instructions were sent to the patient via MyChart unless otherwise noted below.    The patient was advised to call back or seek an in-person evaluation if the symptoms worsen or if the condition fails to improve as anticipated.  Time:  I spent 12 minutes with the patient via telehealth technology discussing the above problems/concerns.    Margaretann Loveless, PA-C

## 2021-06-19 NOTE — Patient Instructions (Signed)
Jesse Andrews, thank you for joining Margaretann Loveless, PA-C for today's virtual visit.  While this provider is not your primary care provider (PCP), if your PCP is located in our provider database this encounter information will be shared with them immediately following your visit.  Consent: (Patient) Jesse Andrews provided verbal consent for this virtual visit at the beginning of the encounter.  Current Medications:  Current Outpatient Medications:    benzonatate (TESSALON) 100 MG capsule, Take 1 capsule (100 mg total) by mouth 3 (three) times daily as needed., Disp: 30 capsule, Rfl: 0   ipratropium (ATROVENT) 0.03 % nasal spray, Place 2 sprays into both nostrils every 12 (twelve) hours., Disp: 30 mL, Rfl: 12   oseltamivir (TAMIFLU) 75 MG capsule, Take 1 capsule (75 mg total) by mouth 2 (two) times daily., Disp: 10 capsule, Rfl: 0   bictegravir-emtricitabine-tenofovir AF (BIKTARVY) 50-200-25 MG TABS tablet, Take 1 tablet by mouth daily., Disp: 30 tablet, Rfl: 1   cetirizine (ZYRTEC) 10 MG tablet, Take 1 tablet (10 mg total) by mouth daily. (Patient not taking: No sig reported), Disp: 30 tablet, Rfl: 0   guaiFENesin (MUCINEX) 600 MG 12 hr tablet, Take 1 tablet (600 mg total) by mouth 2 (two) times daily. (Patient not taking: No sig reported), Disp: 30 tablet, Rfl: 0   hydrocortisone cream 1 %, Apply to affected area 2 times daily, Disp: 15 g, Rfl: 0   ibuprofen (ADVIL,MOTRIN) 200 MG tablet, Take 400 mg by mouth every 6 (six) hours as needed for moderate pain., Disp: , Rfl:    Medications ordered in this encounter:  Meds ordered this encounter  Medications   oseltamivir (TAMIFLU) 75 MG capsule    Sig: Take 1 capsule (75 mg total) by mouth 2 (two) times daily.    Dispense:  10 capsule    Refill:  0    Order Specific Question:   Supervising Provider    Answer:   MILLER, BRIAN [[id number]]   benzonatate (TESSALON) 100 MG capsule    Sig: Take 1 capsule (100 mg total) by mouth 3 (three)  times daily as needed.    Dispense:  30 capsule    Refill:  0    Order Specific Question:   Supervising Provider    Answer:   MILLER, BRIAN [[id number]]   ipratropium (ATROVENT) 0.03 % nasal spray    Sig: Place 2 sprays into both nostrils every 12 (twelve) hours.    Dispense:  30 mL    Refill:  12    Order Specific Question:   Supervising Provider    Answer:   Eber Hong [[id number]]     *If you need refills on other medications prior to your next appointment, please contact your pharmacy*  Follow-Up: Call back or seek an in-person evaluation if the symptoms worsen or if the condition fails to improve as anticipated.  Other Instructions Influenza, Adult Influenza, also called "the flu," is a viral infection that mainly affects the respiratory tract. This includes the lungs, nose, and throat. The flu spreads easily from person to person (is contagious). It causes common cold symptoms, along with high fever and body aches. What are the causes? This condition is caused by the influenza virus. You can get the virus by: Breathing in droplets that are in the air from an infected person's cough or sneeze. Touching something that has the virus on it (has been contaminated) and then touching your mouth, nose, or eyes. What increases the risk? The following factors may make  you more likely to get the flu: Not washing or sanitizing your hands often. Having close contact with many people during cold and flu season. Touching your mouth, eyes, or nose without first washing or sanitizing your hands. Not getting an annual flu shot. You may have a higher risk for the flu, including serious problems, such as a lung infection (pneumonia), if you: Are older than 65. Are pregnant. Have a weakened disease-fighting system (immune system). This includes people who have HIV or AIDS, are on chemotherapy, or are taking medicines that reduce (suppress) the immune system. Have a long-term (chronic) illness, such as  heart disease, kidney disease, diabetes, or lung disease. Have a liver disorder. Are severely overweight (morbidly obese). Have anemia. Have asthma. What are the signs or symptoms? Symptoms of this condition usually begin suddenly and last 4-14 days. These may include: Fever and chills. Headaches, body aches, or muscle aches. Sore throat. Cough. Runny or stuffy (congested) nose. Chest discomfort. Poor appetite. Weakness or fatigue. Dizziness. Nausea or vomiting. How is this diagnosed? This condition may be diagnosed based on: Your symptoms and medical history. A physical exam. Swabbing your nose or throat and testing the fluid for the influenza virus. How is this treated? If the flu is diagnosed early, you can be treated with antiviral medicine that is given by mouth (orally) or through an IV. This can help reduce how severe the illness is and how long it lasts. Taking care of yourself at home can help relieve symptoms. Your health care provider may recommend: Taking over-the-counter medicines. Drinking plenty of fluids. In many cases, the flu goes away on its own. If you have severe symptoms or complications, you may be treated in a hospital. Follow these instructions at home: Activity Rest as needed and get plenty of sleep. Stay home from work or school as told by your health care provider. Unless you are visiting your health care provider, avoid leaving home until your fever has been gone for 24 hours without taking medicine. Eating and drinking Take an oral rehydration solution (ORS). This is a drink that is sold at pharmacies and retail stores. Drink enough fluid to keep your urine pale yellow. Drink clear fluids in small amounts as you are able. Clear fluids include water, ice chips, fruit juice mixed with water, and low-calorie sports drinks. Eat bland, easy-to-digest foods in small amounts as you are able. These foods include bananas, applesauce, rice, lean meats, toast,  and crackers. Avoid drinking fluids that contain a lot of sugar or caffeine, such as energy drinks, regular sports drinks, and soda. Avoid alcohol. Avoid spicy or fatty foods. General instructions   Take over-the-counter and prescription medicines only as told by your health care provider. Use a cool mist humidifier to add humidity to the air in your home. This can make it easier to breathe. When using a cool mist humidifier, clean it daily. Empty the water and replace it with clean water. Cover your mouth and nose when you cough or sneeze. Wash your hands with soap and water often and for at least 20 seconds, especially after you cough or sneeze. If soap and water are not available, use alcohol-based hand sanitizer. Keep all follow-up visits. This is important. How is this prevented?  Get an annual flu shot. This is usually available in late summer, fall, or winter. Ask your health care provider when you should get your flu shot. Avoid contact with people who are sick during cold and flu season. This is  generally fall and winter. Contact a health care provider if: You develop new symptoms. You have: Chest pain. Diarrhea. A fever. Your cough gets worse. You produce more mucus. You feel nauseous or you vomit. Get help right away if you: Develop shortness of breath or have difficulty breathing. Have skin or nails that turn a bluish color. Have severe pain or stiffness in your neck. Develop a sudden headache or sudden pain in your face or ear. Cannot eat or drink without vomiting. These symptoms may represent a serious problem that is an emergency. Do not wait to see if the symptoms will go away. Get medical help right away. Call your local emergency services (911 in the [place] Do not drive yourself to the hospital. Summary Influenza, also called "the flu," is a viral infection that primarily affects your respiratory tract. Symptoms of the flu usually begin suddenly and last 4-14  days. Getting an annual flu shot is the best way to prevent getting the flu. Stay home from work or school as told by your health care provider. Unless you are visiting your health care provider, avoid leaving home until your fever has been gone for 24 hours without taking medicine. Keep all follow-up visits. This is important. This information is not intended to replace advice given to you by your health care provider. Make sure you discuss any questions you have with your health care provider. Document Revised: 03/22/2020 Document Reviewed: 03/22/2020 Elsevier Patient Education  2022 [employer].    If you have been instructed to have an in-person evaluation today at a local Urgent Care facility, please use the link below. It will take you to a list of all of our available [facility], including address, phone number and hours of operation. Please do not delay care.  [facility]  If you or a family member do not have a primary care provider, use the link below to schedule a visit and establish care. When you choose a [facility] primary care physician or advanced practice provider, you gain a long-term partner in health. Find a Primary Care Provider  Learn more about [facility]'s in-office and virtual care options: [facility] - Get Care Now

## 2021-07-24 ENCOUNTER — Other Ambulatory Visit: Payer: Self-pay

## 2021-07-24 ENCOUNTER — Ambulatory Visit: Payer: Self-pay

## 2021-07-24 DIAGNOSIS — B20 Human immunodeficiency virus [HIV] disease: Secondary | ICD-10-CM

## 2021-07-25 ENCOUNTER — Encounter: Payer: Self-pay | Admitting: Infectious Diseases

## 2021-07-25 LAB — T-HELPER CELL (CD4) - (RCID CLINIC ONLY)
CD4 % Helper T Cell: 3 % — ABNORMAL LOW (ref 33–65)
CD4 T Cell Abs: 50 /uL — ABNORMAL LOW (ref 400–1790)

## 2021-07-27 LAB — HIV-1 RNA QUANT-NO REFLEX-BLD
HIV 1 RNA Quant: 55500 Copies/mL — ABNORMAL HIGH
HIV-1 RNA Quant, Log: 4.74 Log cps/mL — ABNORMAL HIGH

## 2021-07-27 LAB — COMPREHENSIVE METABOLIC PANEL
AG Ratio: 1.1 (calc) (ref 1.0–2.5)
ALT: 14 U/L (ref 9–46)
AST: 13 U/L (ref 10–40)
Albumin: 4 g/dL (ref 3.6–5.1)
Alkaline phosphatase (APISO): 73 U/L (ref 36–130)
BUN: 13 mg/dL (ref 7–25)
CO2: 26 mmol/L (ref 20–32)
Calcium: 9.5 mg/dL (ref 8.6–10.3)
Chloride: 103 mmol/L (ref 98–110)
Creat: 0.66 mg/dL (ref 0.60–1.24)
Globulin: 3.8 g/dL (calc) — ABNORMAL HIGH (ref 1.9–3.7)
Glucose, Bld: 102 mg/dL — ABNORMAL HIGH (ref 65–99)
Potassium: 3.7 mmol/L (ref 3.5–5.3)
Sodium: 140 mmol/L (ref 135–146)
Total Bilirubin: 0.8 mg/dL (ref 0.2–1.2)
Total Protein: 7.8 g/dL (ref 6.1–8.1)

## 2021-07-27 LAB — CBC
HCT: 41.5 % (ref 38.5–50.0)
Hemoglobin: 14.1 g/dL (ref 13.2–17.1)
MCH: 29.8 pg (ref 27.0–33.0)
MCHC: 34 g/dL (ref 32.0–36.0)
MCV: 87.7 fL (ref 80.0–100.0)
MPV: 9.6 fL (ref 7.5–12.5)
Platelets: 218 10*3/uL (ref 140–400)
RBC: 4.73 10*6/uL (ref 4.20–5.80)
RDW: 13.5 % (ref 11.0–15.0)
WBC: 3.9 10*3/uL (ref 3.8–10.8)

## 2021-08-07 ENCOUNTER — Other Ambulatory Visit: Payer: Self-pay

## 2021-08-07 ENCOUNTER — Encounter: Payer: Self-pay | Admitting: Infectious Diseases

## 2021-08-07 ENCOUNTER — Ambulatory Visit: Payer: Self-pay | Admitting: Infectious Diseases

## 2021-08-07 VITALS — BP 152/91 | HR 91 | Temp 98.4°F | Wt 178.0 lb

## 2021-08-07 DIAGNOSIS — L989 Disorder of the skin and subcutaneous tissue, unspecified: Secondary | ICD-10-CM | POA: Insufficient documentation

## 2021-08-07 DIAGNOSIS — Z113 Encounter for screening for infections with a predominantly sexual mode of transmission: Secondary | ICD-10-CM

## 2021-08-07 DIAGNOSIS — Z79899 Other long term (current) drug therapy: Secondary | ICD-10-CM

## 2021-08-07 DIAGNOSIS — I159 Secondary hypertension, unspecified: Secondary | ICD-10-CM

## 2021-08-07 DIAGNOSIS — B20 Human immunodeficiency virus [HIV] disease: Secondary | ICD-10-CM

## 2021-08-07 DIAGNOSIS — I10 Essential (primary) hypertension: Secondary | ICD-10-CM | POA: Insufficient documentation

## 2021-08-07 NOTE — Assessment & Plan Note (Signed)
Isolated reading asx Will f/u at his next visit.

## 2021-08-07 NOTE — Assessment & Plan Note (Signed)
Spoke with him about cabaneuva which he is hesitant about.  Will give him pill box.  Encouraged him to take art in evening.  Not sexually active Defers vax Will get into dental (Dental referral placed today for [facility]. Information to schedule appointment completed today.).  rtc in 3 months.

## 2021-08-07 NOTE — Assessment & Plan Note (Signed)
Could be subaceous cyst but excreta is unusual.  Does not appear to be acne.  Pt states only present 1 week so SCC or BCC would seem unusual.  Will have him see by derm (could this be BCC or SCC or Bartonella?)

## 2021-08-07 NOTE — Progress Notes (Signed)
° °  Subjective:    Patient ID: Jesse Andrews, male  DOB: 1995-09-06, 25 y.o.        MRN: [id number]   HPI 25 y.o. male with hx of HIV+ in 2015 which he denies knowledge of. He was adm 02-18-18 he was treated as PCP (bactrim, no steroids) and was started on biktravy. He continues to have intermittent adherence to his ART.  Per nursing was taking qod. Has tried different reminders. Has difficulty remembering to take in AM when he has to go to work.  Has not had dental appt.  Has not gotten COVID booster yet. Original shots made him sick, he is scared to take further.    HIV 1 RNA Quant  Date Value  07/24/2021 55,500 Copies/mL (H)  01/28/2021 44,300 Copies/mL (H)  09/09/2018 <20 NOT DETECTED copies/mL   CD4 T Cell Abs (/uL)  Date Value  07/24/2021 50 (L)  01/28/2021 36 (L)  09/09/2018 230 (L)     Health Maintenance  Topic Date Due   TETANUS/TDAP  Never done   INFLUENZA VACCINE  03/17/2021   Pneumococcal Vaccine 67-22 Years old (3 - PPSV23 if available, else PCV20) 02/11/2026   Hepatitis C Screening  Completed   HIV Screening  Completed   HPV VACCINES  Aged Out      Review of Systems  Constitutional:  Negative for chills, fever and weight loss.  Respiratory:  Negative for shortness of breath.   Cardiovascular:  Negative for chest pain.  Gastrointestinal:  Negative for constipation and diarrhea.  Genitourinary:  Negative for dysuria.  Neurological:  Negative for headaches.  Psychiatric/Behavioral:  The patient does not have insomnia.    Please see HPI. All other systems reviewed and negative.     Objective:  Physical Exam Vitals reviewed.  Constitutional:      Appearance: Normal appearance.  Eyes:     Extraocular Movements: Extraocular movements intact.     Pupils: Pupils are equal, round, and reactive to light.  Cardiovascular:     Rate and Rhythm: Normal rate and regular rhythm.  Pulmonary:     Effort: Pulmonary effort is normal.     Breath sounds: Normal  breath sounds.  Abdominal:     General: Bowel sounds are normal. There is no distension.     Palpations: Abdomen is soft.     Tenderness: There is no abdominal tenderness.  Musculoskeletal:     Cervical back: Normal range of motion and neck supple.     Right lower leg: No edema.     Left lower leg: No edema.  Skin:         Comments: ~ 1 cm irregular, ulcerated lesion below R eye. Small amt of pus expressed.   Neurological:     Mental Status: He is alert.           Assessment & Plan:

## 2021-08-26 ENCOUNTER — Telehealth: Payer: Self-pay | Admitting: Dermatology

## 2021-08-26 NOTE — Telephone Encounter (Signed)
Referral from Dr Ninetta Lights doesn't want to wait until July. Please see if they can get him in elsewhere sooner

## 2021-08-27 ENCOUNTER — Telehealth: Payer: Self-pay

## 2021-08-27 NOTE — Telephone Encounter (Signed)
Patient called, says he had a bump on his face when he saw Dr. Ninetta Lights last month and that he now has numerous bumps on his body. He expresses concern that it could be monkeypox. He accepts appointment for testing tomorrow morning. Advised him to cover rash and avoid direct contact with others.   Sandie Ano, RN

## 2021-08-27 NOTE — Telephone Encounter (Signed)
Notes documented and referral routed back to referring office. 

## 2021-08-28 ENCOUNTER — Ambulatory Visit (INDEPENDENT_AMBULATORY_CARE_PROVIDER_SITE_OTHER): Payer: Self-pay | Admitting: Physician Assistant

## 2021-08-28 ENCOUNTER — Other Ambulatory Visit: Payer: Self-pay

## 2021-08-28 ENCOUNTER — Encounter: Payer: Self-pay | Admitting: Physician Assistant

## 2021-08-28 ENCOUNTER — Ambulatory Visit: Payer: Self-pay | Admitting: Internal Medicine

## 2021-08-28 VITALS — BP 130/76 | HR 91 | Temp 98.6°F | Resp 16 | Wt 170.0 lb

## 2021-08-28 DIAGNOSIS — R21 Rash and other nonspecific skin eruption: Secondary | ICD-10-CM

## 2021-08-28 DIAGNOSIS — L0100 Impetigo, unspecified: Secondary | ICD-10-CM

## 2021-08-28 DIAGNOSIS — B20 Human immunodeficiency virus [HIV] disease: Secondary | ICD-10-CM

## 2021-08-28 MED ORDER — MUPIROCIN 2 % EX OINT: 1.0000 "application " | TOPICAL_OINTMENT | Freq: Two times a day (BID) | CUTANEOUS | 0 refills | Status: AC

## 2021-08-28 MED ORDER — SULFAMETHOXAZOLE-TRIMETHOPRIM 400-80 MG PO TABS: 1.0000 | ORAL_TABLET | Freq: Every day | ORAL | 0 refills | Status: DC

## 2021-08-28 MED ORDER — DOXYCYCLINE HYCLATE 100 MG PO TABS: 100.0000 mg | ORAL_TABLET | Freq: Two times a day (BID) | ORAL | 0 refills | Status: AC

## 2021-08-28 NOTE — Progress Notes (Addendum)
Subjective:    Patient ID: Jesse Andrews, male    DOB: 07-28-1996, 26 y.o.   MRN: [id number]  Chief Complaint  Patient presents with   Rash     HPI:  Jesse Andrews is a 26 y.o. male living with poorly controlled HIV-1.  Since last visit 08/07/21 he has improved his adherence to [place], but admits to missing at least one dose per week. 07/24/21 VL 55,500 and CD4 50.  He is not taking primary prophylaxis. He presents today regarding concern for lesions that began 5 weeks ago and have begun to spread since 08/13/21, initial lesion on face onset 07/31/21.  New lesions are present along back, left thigh, inner right thigh, right knee and ankle, and right forearm. Describes lesions as raised, red and dry.  Lesion on face he admits to "scratching" and has drained some purulent discharge, some discomfort of lesion on knee due to friction from pants. Otherwise denies pruritis, erythema, fever, abd pain, nausea, HA, diarrhea, neck pain. He denies known contacts with similar lesions.  No exposure to mpx. No sexual intimacy since prior to thanksgiving. No recent travel apart form [place]. Work at [employer]. He has no pets. He does recall one day in December of feeling fatigued and feverish.  He has a dermatology placed by Dr. Johnnye Sima 08/07/21 but first appt was not available until June 2023. He has no insurance.     Allergies  Allergen Reactions   Penicillin G    Penicillins     Childhood allergy, doesn't know      Outpatient Medications Prior to Visit  Medication Sig Dispense Refill   benzonatate (TESSALON) 100 MG capsule Take 1 capsule (100 mg total) by mouth 3 (three) times daily as needed. (Patient not taking: Reported on 08/07/2021) 30 capsule 0   bictegravir-emtricitabine-tenofovir AF (BIKTARVY) 50-200-25 MG TABS tablet Take 1 tablet by mouth daily. 30 tablet 1   cetirizine (ZYRTEC) 10 MG tablet Take 1 tablet (10 mg total) by mouth daily. (Patient not taking: Reported on 09/09/2018) 30  tablet 0   guaiFENesin (MUCINEX) 600 MG 12 hr tablet Take 1 tablet (600 mg total) by mouth 2 (two) times daily. (Patient not taking: Reported on 09/09/2018) 30 tablet 0   hydrocortisone cream 1 % Apply to affected area 2 times daily (Patient not taking: Reported on 08/07/2021) 15 g 0   ibuprofen (ADVIL,MOTRIN) 200 MG tablet Take 400 mg by mouth every 6 (six) hours as needed for moderate pain. (Patient not taking: Reported on 08/07/2021)     ipratropium (ATROVENT) 0.03 % nasal spray Place 2 sprays into both nostrils every 12 (twelve) hours. (Patient not taking: Reported on 08/07/2021) 30 mL 12   oseltamivir (TAMIFLU) 75 MG capsule Take 1 capsule (75 mg total) by mouth 2 (two) times daily. (Patient not taking: Reported on 08/07/2021) 10 capsule 0   No facility-administered medications prior to visit.     Past Medical History:  Diagnosis Date   Chlamydia 2014   Gonorrhea 2014   HIV disease ([place]) 2015   Syphilis      No past surgical history on file.     Review of Systems  Constitutional:  Negative for appetite change, chills, fatigue, fever and unexpected weight change.  HENT:  Negative for mouth sores, sore throat and trouble swallowing.   Eyes:  Negative for visual disturbance.  Respiratory:  Negative for cough, chest tightness and wheezing.   Genitourinary:  Negative for frequency, penile discharge, penile pain, penile swelling, testicular  pain and urgency.  Musculoskeletal:  Negative for arthralgias, back pain, gait problem and neck pain.  Skin:  Positive for rash. Negative for color change and pallor.  Allergic/Immunologic: Positive for immunocompromised state. Negative for environmental allergies and food allergies.  Neurological:  Negative for dizziness, weakness, light-headedness and headaches.  Hematological:  Negative for adenopathy.  Psychiatric/Behavioral:  Negative for agitation, behavioral problems and self-injury.      Objective:    BP 130/76    Pulse 91    Temp  98.6 F (37 C)    Resp 16    Wt 170 lb (77.1 kg)    BMI 21.25 kg/m  Nursing note and vital signs reviewed.  Physical Exam Vitals reviewed. Exam conducted with a chaperone present.  Constitutional:      General: He is not in acute distress.    Appearance: Normal appearance. He is normal weight. He is not ill-appearing, toxic-appearing or diaphoretic.  HENT:     Head: Normocephalic and atraumatic.     Mouth/Throat:     Mouth: Mucous membranes are dry.     Pharynx: Oropharynx is clear.  Eyes:     Extraocular Movements: Extraocular movements intact.     Conjunctiva/sclera: Conjunctivae normal.     Pupils: Pupils are equal, round, and reactive to light.  Cardiovascular:     Rate and Rhythm: Normal rate and regular rhythm.  Pulmonary:     Effort: Pulmonary effort is normal.     Breath sounds: Normal breath sounds.  Musculoskeletal:        General: Normal range of motion.  Skin:    General: Skin is warm and dry.     Findings: Lesion and rash present. Rash is crusting, nodular, papular, scaling and vesicular.     Comments: Lesions on right cheek near nasal fold approximatley 2.5 cm diameter, nttp, ulcerative with scaling and crusting, epithelialization, erythematous margins, minimal d/c present, not purulent. Right forearm, ankle, knee, back similar well demarcated 2+ cm ulcerative lesions with scaling and nodular, purplish discoloration, nttp, no exudate or d/c. See images.  Neurological:     General: No focal deficit present.     Mental Status: He is alert and oriented to person, place, and time.     Gait: Gait normal.  Psychiatric:        Mood and Affect: Mood normal.        Behavior: Behavior normal.        Thought Content: Thought content normal.        Judgment: Judgment normal.            Depression screen [facility] 2/9 08/07/2021 02/11/2021 09/09/2018  Decreased Interest 0 0 0  Down, Depressed, Hopeless 0 0 0  PHQ - 2 Score 0 0 0       Assessment & Plan:  Reviewed Dr.  Algis Downs note 08/07/21. Earliest Derm appt June 2023- Referral to Gen surg for punch biopsy pathology: fungal, mycobacterium, bacterial culture Uncontrolled HIV-1-discussed ways to improve adherence, ordered VL w genotype for integrase if considering [place] in the future Rash-lesions appears ulcerative and dry, not improving or worsening otherwise asymptomatic. Suspicious for KS, bartonella, crypto, blastomycosis, histo. 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   Hypoxia 02/18/2018   HIV disease ([place]) 12/28/2013   Syphilis 12/28/2013   Gonorrhea 12/28/2013   Chlamydia infection 12/28/2013     Problem List Items Addressed This Visit       Other   HIV disease (HCC) (Chronic)   Relevant Medications   mupirocin ointment (BACTROBAN) 2 %   sulfamethoxazole-trimethoprim (BACTRIM) 400-80 MG tablet   Other Visit Diagnoses     Rash and nonspecific skin eruption    -  Primary   Relevant Medications   mupirocin ointment (BACTROBAN) 2 %   Other Relevant Orders   Bartonella anitbody panel   Ambulatory referral to Dermatology   Cryptococcal Ag, Ltx Scr Rflx Titer   Ambulatory referral to General Surgery   Blastomyces Ab, ID   Blastomyces Antigen   Histoplasma Antigen, Urine   [facility]ista Blastomyces Qnt. Ag (Urine)   AIDS (acquired immune deficiency syndrome) (HCC)       Relevant Medications   mupirocin ointment (BACTROBAN) 2 %   sulfamethoxazole-trimethoprim (BACTRIM) 400-80 MG tablet   Other Relevant Orders   HIV-1 RNA ultraquant reflex to gentyp+   Impetigo       Relevant Medications   mupirocin ointment (BACTROBAN) 2 %   sulfamethoxazole-trimethoprim (BACTRIM) 400-80 MG  tablet   doxycycline (VIBRA-TABS) 100 MG tablet        I am having Male Enwright start on mupirocin ointment, sulfamethoxazole-trimethoprim, and doxycycline. I am also having him maintain his hydrocortisone cream, ibuprofen, cetirizine, guaiFENesin, Biktarvy, oseltamivir, benzonatate, and ipratropium.   Meds ordered this encounter  Medications   mupirocin ointment (BACTROBAN) 2 %    Sig: Apply 1 application topically 2 (two) times daily.    Dispense:  22 g    Refill:  0    Order Specific Question:   Supervising Provider    Answer:   VAN DAM, CORNELIUS N [[id number]]   sulfamethoxazole-trimethoprim (BACTRIM) 400-80 MG tablet    Sig: Take 1 tablet by mouth daily.    Dispense:  30 tablet    Refill:  0    Order Specific Question:   Supervising Provider    Answer:   VAN DAM, CORNELIUS N [[id number]]   doxycycline (VIBRA-TABS) 100 MG tablet    Sig: Take 1 tablet (100 mg total) by mouth 2 (two) times daily for 7 days.    Dispense:  14 tablet    Refill:  0    Order Specific Question:   Supervising Provider    Answer:   VAN DAM, CORNELIUS N [id number]     Follow-up: Return if symptoms worsen or fail to improve.

## 2021-08-28 NOTE — Patient Instructions (Addendum)
Weylyn nice meeting you today, I believe for now while we await lab results and dermatology appointment you should be diligent with taking Biktarvy.  Set an alarm on your phone everyday to set a reminder. I discourage your from "manually picking" at the scabs since that can introduce bacteria.  I am going to send to your pharmacy mupirocin ointment to apply twice daily to face since that appears to have a secondary infection. Along with Bactrim DS to prevent pneumonia while your immune system recover. This will not resolve the lesions but will help the possible secondary infection.

## 2021-08-28 NOTE — Addendum Note (Signed)
Addended by: Silvio Pate R on: 08/28/2021 02:02 PM   Modules accepted: Orders

## 2021-08-28 NOTE — Addendum Note (Signed)
Addended by: Harley Alto on: 08/28/2021 04:25 PM   Modules accepted: Orders

## 2021-08-28 NOTE — Addendum Note (Signed)
Addended by: Harley Alto on: 08/28/2021 12:18 PM   Modules accepted: Orders

## 2021-09-01 ENCOUNTER — Ambulatory Visit: Payer: Self-pay | Admitting: Physician Assistant

## 2021-09-01 NOTE — Telephone Encounter (Signed)
Attempted to contact via mobile, did not respond to Mychart message regarding needing to return for additional labs.

## 2021-09-03 ENCOUNTER — Telehealth: Payer: Self-pay

## 2021-09-03 NOTE — Telephone Encounter (Signed)
Shows pending review with Dr Elder Love in [facility]

## 2021-09-03 NOTE — Telephone Encounter (Signed)
Left voicemail asking patient to return my call.   Jerick Khachatryan P Gunda Maqueda, CMA  

## 2021-09-03 NOTE — Telephone Encounter (Signed)
-----   Message from Horton Finer, [place] sent at 09/03/2021  8:27 AM EST ----- Please notify Kolbi both tests for bartonella and cryptococcus were negative.  I had left a voicemail and mychart message for him to return for further testing.  He has not responded please call him and schedule a lab visit. Did he get scheduled with Gen surg for biopsy?

## 2021-09-08 ENCOUNTER — Other Ambulatory Visit: Payer: Self-pay | Admitting: Physician Assistant

## 2021-09-08 ENCOUNTER — Other Ambulatory Visit: Payer: Self-pay

## 2021-09-08 ENCOUNTER — Telehealth: Payer: Self-pay | Admitting: Physician Assistant

## 2021-09-08 ENCOUNTER — Other Ambulatory Visit (HOSPITAL_COMMUNITY): Payer: Self-pay

## 2021-09-08 DIAGNOSIS — B20 Human immunodeficiency virus [HIV] disease: Secondary | ICD-10-CM

## 2021-09-08 DIAGNOSIS — J111 Influenza due to unidentified influenza virus with other respiratory manifestations: Secondary | ICD-10-CM

## 2021-09-08 MED ORDER — BIKTARVY 50-200-25 MG PO TABS
1.0000 | ORAL_TABLET | Freq: Every day | ORAL | 5 refills | Status: DC
  Filled 2021-09-08: qty 30, 30d supply, fill #0

## 2021-09-08 NOTE — Progress Notes (Signed)
Jesse Andrews left a voicemail stating he will call back to return for lab work and does need BIK refilled, I sent to [facility].

## 2021-09-08 NOTE — Telephone Encounter (Signed)
Patient scheduled for lab appt.

## 2021-09-08 NOTE — Telephone Encounter (Signed)
Left another message for Havard to return call urgently to complete lab work and schedule biopsy, attempting to get urgent dermatology, gen surg referral.  Gave call back number [phone]

## 2021-09-09 ENCOUNTER — Other Ambulatory Visit: Payer: Self-pay | Admitting: Pharmacist

## 2021-09-09 ENCOUNTER — Other Ambulatory Visit: Payer: Self-pay

## 2021-09-09 ENCOUNTER — Other Ambulatory Visit (HOSPITAL_COMMUNITY): Payer: Self-pay

## 2021-09-09 DIAGNOSIS — R21 Rash and other nonspecific skin eruption: Secondary | ICD-10-CM

## 2021-09-09 DIAGNOSIS — B20 Human immunodeficiency virus [HIV] disease: Secondary | ICD-10-CM

## 2021-09-09 MED ORDER — BIKTARVY 50-200-25 MG PO TABS: 1.0000 | ORAL_TABLET | Freq: Every day | ORAL | 5 refills | Status: DC

## 2021-09-09 NOTE — Addendum Note (Signed)
Addended by: Harley Alto on: 09/09/2021 04:05 PM   Modules accepted: Orders

## 2021-09-10 ENCOUNTER — Telehealth: Payer: Self-pay | Admitting: Family

## 2021-09-10 ENCOUNTER — Encounter: Payer: Self-pay | Admitting: Family

## 2021-09-10 DIAGNOSIS — R6889 Other general symptoms and signs: Secondary | ICD-10-CM

## 2021-09-10 LAB — CRYPTOCOCCAL AG, LTX SCR RFLX TITER
Cryptococcal Ag Screen: NOT DETECTED
MICRO NUMBER:: 12864440
SPECIMEN QUALITY:: ADEQUATE

## 2021-09-10 LAB — HIV-1 RNA ULTRAQUANT REFLEX TO GENTYP+
HIV 1 RNA Quant: 58700 copies/mL — ABNORMAL HIGH
HIV-1 RNA Quant, Log: 4.77 Log copies/mL — ABNORMAL HIGH

## 2021-09-10 LAB — BARTONELLA ANTIBODY PANEL
B. henselae IgG Screen: NEGATIVE
B. henselae IgM Screen: NEGATIVE

## 2021-09-10 LAB — HIV-1 GENOTYPE: HIV-1 Genotype: DETECTED — AB

## 2021-09-10 MED ORDER — OSELTAMIVIR PHOSPHATE 75 MG PO CAPS: 75.0000 mg | ORAL_CAPSULE | Freq: Two times a day (BID) | ORAL | 0 refills | Status: DC

## 2021-09-10 NOTE — Progress Notes (Signed)
Pt completed Evisit. Will close out visit.   Jannifer Rodney, FNP

## 2021-09-10 NOTE — Progress Notes (Signed)

## 2021-09-12 ENCOUNTER — Telehealth: Payer: Self-pay | Admitting: Physician Assistant

## 2021-09-12 NOTE — Telephone Encounter (Signed)
Spoke with patient regarding lab work results and need for skin biopsy he is calling walk-in derm clinic provided to have this completed.

## 2021-09-19 LAB — HIV-1 INTEGRASE GENOTYPE: Value last viral load: 55500 copies/mL

## 2021-09-19 LAB — BLASTOMYCES AB, ID: Blastomyces Abs, Qn, DID: NEGATIVE

## 2021-09-19 LAB — MVISTA BLASTOMYCES QNT AG, URINE
Interpretation: NEGATIVE
Result (ng/ml): NOT DETECTED ng/mL

## 2021-09-19 LAB — HISTOPLASMA ANTIGEN, URINE: Histoplasma Antigen, urine: <0.2 ng/mL

## 2021-10-16 ENCOUNTER — Other Ambulatory Visit: Payer: Self-pay

## 2021-10-16 DIAGNOSIS — Z113 Encounter for screening for infections with a predominantly sexual mode of transmission: Secondary | ICD-10-CM

## 2021-10-16 DIAGNOSIS — B20 Human immunodeficiency virus [HIV] disease: Secondary | ICD-10-CM

## 2021-10-17 ENCOUNTER — Other Ambulatory Visit: Payer: Self-pay

## 2021-10-17 DIAGNOSIS — B20 Human immunodeficiency virus [HIV] disease: Secondary | ICD-10-CM

## 2021-10-17 MED ORDER — SULFAMETHOXAZOLE-TRIMETHOPRIM 400-80 MG PO TABS: 1.0000 | ORAL_TABLET | Freq: Every day | ORAL | 5 refills | Status: DC

## 2021-10-21 ENCOUNTER — Other Ambulatory Visit: Payer: Self-pay

## 2021-10-21 DIAGNOSIS — Z79899 Other long term (current) drug therapy: Secondary | ICD-10-CM

## 2021-10-21 DIAGNOSIS — B20 Human immunodeficiency virus [HIV] disease: Secondary | ICD-10-CM

## 2021-10-21 DIAGNOSIS — Z113 Encounter for screening for infections with a predominantly sexual mode of transmission: Secondary | ICD-10-CM

## 2021-10-23 LAB — COMPLETE METABOLIC PANEL WITH GFR
AG Ratio: 1.5 (calc) (ref 1.0–2.5)
ALT: 14 U/L (ref 9–46)
AST: 12 U/L (ref 10–40)
Albumin: 4.6 g/dL (ref 3.6–5.1)
Alkaline phosphatase (APISO): 59 U/L (ref 36–130)
BUN: 13 mg/dL (ref 7–25)
CO2: 29 mmol/L (ref 20–32)
Calcium: 9.7 mg/dL (ref 8.6–10.3)
Chloride: 106 mmol/L (ref 98–110)
Creat: 0.91 mg/dL (ref 0.60–1.24)
Globulin: 3.1 g/dL (calc) (ref 1.9–3.7)
Glucose, Bld: 89 mg/dL (ref 65–99)
Potassium: 4.1 mmol/L (ref 3.5–5.3)
Sodium: 142 mmol/L (ref 135–146)
Total Bilirubin: 0.7 mg/dL (ref 0.2–1.2)
Total Protein: 7.7 g/dL (ref 6.1–8.1)
eGFR: 118 mL/min/{1.73_m2} (ref >=60)

## 2021-10-23 LAB — CBC
HCT: 45.5 % (ref 38.5–50.0)
Hemoglobin: 15 g/dL (ref 13.2–17.1)
MCH: 29.9 pg (ref 27.0–33.0)
MCHC: 33 g/dL (ref 32.0–36.0)
MCV: 90.8 fL (ref 80.0–100.0)
MPV: 9.8 fL (ref 7.5–12.5)
Platelets: 216 10*3/uL (ref 140–400)
RBC: 5.01 10*6/uL (ref 4.20–5.80)
RDW: 13.4 % (ref 11.0–15.0)
WBC: 2.8 10*3/uL — ABNORMAL LOW (ref 3.8–10.8)

## 2021-10-23 LAB — T-HELPER CELLS (CD4) COUNT (NOT AT ARMC)
Absolute CD4: 74 cells/uL — ABNORMAL LOW (ref 490–1740)
CD4 T Helper %: 6 % — ABNORMAL LOW (ref 30–61)
Total lymphocyte count: 1314 cells/uL (ref 850–3900)

## 2021-10-23 LAB — RPR: RPR Ser Ql: REACTIVE — AB

## 2021-10-23 LAB — FLUORESCENT TREPONEMAL AB(FTA)-IGG-BLD: Fluorescent Treponemal ABS: REACTIVE — AB

## 2021-10-23 LAB — C. TRACHOMATIS/N. GONORRHOEAE RNA
C. trachomatis RNA, TMA: NOT DETECTED
N. gonorrhoeae RNA, TMA: NOT DETECTED

## 2021-10-23 LAB — HIV-1 RNA QUANT-NO REFLEX-BLD
HIV 1 RNA Quant: <20 Copies/mL — ABNORMAL HIGH
HIV-1 RNA Quant, Log: <1.3 Log cps/mL — ABNORMAL HIGH

## 2021-10-23 LAB — LIPID PANEL
Cholesterol: 141 mg/dL (ref <200)
HDL: 54 mg/dL (ref >=40)
LDL Cholesterol (Calc): 73 mg/dL (calc)
Non-HDL Cholesterol (Calc): 87 mg/dL (calc) (ref <130)
Total CHOL/HDL Ratio: 2.6 (calc) (ref <5.0)
Triglycerides: 49 mg/dL (ref <150)

## 2021-10-23 LAB — RPR TITER: RPR Titer: 1:2 {titer} — ABNORMAL HIGH

## 2021-11-06 ENCOUNTER — Encounter: Payer: Self-pay | Admitting: Infectious Diseases

## 2021-11-06 ENCOUNTER — Ambulatory Visit: Payer: Self-pay | Admitting: Infectious Diseases

## 2021-11-06 ENCOUNTER — Other Ambulatory Visit: Payer: Self-pay

## 2021-11-06 DIAGNOSIS — L989 Disorder of the skin and subcutaneous tissue, unspecified: Secondary | ICD-10-CM

## 2021-11-06 DIAGNOSIS — B59 Pneumocystosis: Secondary | ICD-10-CM

## 2021-11-06 DIAGNOSIS — A539 Syphilis, unspecified: Secondary | ICD-10-CM

## 2021-11-06 DIAGNOSIS — B20 Human immunodeficiency virus [HIV] disease: Secondary | ICD-10-CM

## 2021-11-06 NOTE — Assessment & Plan Note (Signed)
We reviewed his labs, his RPR has come down.  ?I explained this and re-assured him.  ?

## 2021-11-06 NOTE — Assessment & Plan Note (Signed)
Will change bactrim to TIW ?

## 2021-11-06 NOTE — Assessment & Plan Note (Signed)
Will have him back in 4 weeks to f/u his bx result.  ?

## 2021-11-06 NOTE — Progress Notes (Signed)
? ?  Subjective:  ? ? Patient ID: Jesse Andrews, male  DOB: 26-Jan-1996, 26 y.o.        MRN: [id number] ? ? ?HPI ?26 y.o. male with hx of HIV+ in 2015 which he denies knowledge of. He was adm 02-18-18 he was treated as PCP (bactrim, no steroids) and was started on biktravy. ?He has been taking his medications much more regularly. He is using a pill box now.  Does not like taking tmp-smx.  ?Has not had dental appt.  ? ?At his prev visit, was sent to derm for a lesion on R cheek just below his eye. Marland Kitchen  ?He had Bx results pending, March 7 ?He had bartonella, crypto serologies negative.  ?Doesn't feel different.  ? ?CD4 74.  ? ?HIV 1 RNA Quant  ?Date Value  ?10/21/2021 <20 Copies/mL (H)  ?08/28/2021 58,700 copies/mL (H)  ?07/24/2021 55,500 Copies/mL (H)  ? ?CD4 T Cell Abs (/uL)  ?Date Value  ?07/24/2021 50 (L)  ?01/28/2021 36 (L)  ?09/09/2018 230 (L)  ? ? ? ?Health Maintenance  ?Topic Date Due  ? TETANUS/TDAP  Never done  ? INFLUENZA VACCINE  03/17/2021  ? Hepatitis C Screening  Completed  ? HIV Screening  Completed  ? HPV VACCINES  Aged Out  ? ? ? ? ?Review of Systems  ?Constitutional:  Negative for chills and fever.  ?Respiratory:  Negative for cough and shortness of breath.   ?Gastrointestinal:  Negative for constipation and diarrhea.  ?Genitourinary:  Negative for dysuria.  ?Psychiatric/Behavioral:  The patient does not have insomnia.   ? ?Please see HPI. All other systems reviewed and negative. ? ?   ?Objective:  ?Physical Exam ?Vitals reviewed.  ?Constitutional:   ?   General: He is not in acute distress. ?   Appearance: He is not toxic-appearing.  ?HENT:  ?   Mouth/Throat:  ?   Mouth: Mucous membranes are moist.  ?   Pharynx: Oropharynx is clear.  ?Eyes:  ?   Extraocular Movements: Extraocular movements intact.  ?   Pupils: Pupils are equal, round, and reactive to light.  ?Cardiovascular:  ?   Rate and Rhythm: Normal rate and regular rhythm.  ?Pulmonary:  ?   Effort: Pulmonary effort is normal.  ?   Breath sounds:  Normal breath sounds.  ?Abdominal:  ?   General: Abdomen is flat. Bowel sounds are normal. There is no distension.  ?   Tenderness: There is no abdominal tenderness.  ?Musculoskeletal:     ?   General: Normal range of motion.  ?   Cervical back: Normal range of motion and neck supple.  ?   Right lower leg: No edema.  ?   Left lower leg: No edema.  ?Neurological:  ?   General: No focal deficit present.  ? ? ? ? ?   ?Assessment & Plan:  ? ?

## 2021-11-06 NOTE — Assessment & Plan Note (Addendum)
He is doing much better with his ART ?I encouraged him to take COVID/Flu/HPV/Tdap vax. He defers.  ?Offered/refused condoms.  ?Will chang his bactrim to TIW. Ill stop when CD4 > 100.  ?He is going to f/u with dental. ?rtc in 1 month.  ?

## 2021-12-18 ENCOUNTER — Ambulatory Visit: Payer: Self-pay | Admitting: Infectious Diseases

## 2022-01-07 ENCOUNTER — Encounter: Payer: Self-pay | Admitting: Infectious Diseases

## 2022-11-03 ENCOUNTER — Ambulatory Visit
Admission: EM | Admit: 2022-11-03 | Discharge: 2022-11-03 | Disposition: A | Discharge status: Home / Self Care | Payer: Managed Care, Other (non HMO) | Attending: Nurse Practitioner | Admitting: Nurse Practitioner

## 2022-11-03 DIAGNOSIS — Z1152 Encounter for screening for COVID-19: Secondary | ICD-10-CM | POA: Insufficient documentation

## 2022-11-03 DIAGNOSIS — B349 Viral infection, unspecified: Secondary | ICD-10-CM

## 2022-11-03 DIAGNOSIS — R509 Fever, unspecified: Secondary | ICD-10-CM

## 2022-11-03 LAB — POCT INFLUENZA A/B
Influenza A, POC: NEGATIVE
Influenza B, POC: NEGATIVE

## 2022-11-03 MED ORDER — PROMETHAZINE-DM 6.25-15 MG/5ML PO SYRP: 5.0000 mL | ORAL_SOLUTION | Freq: Four times a day (QID) | ORAL | 0 refills | Status: DC | PRN

## 2022-11-03 NOTE — ED Triage Notes (Signed)
Patient c/o fever (101.14F, started last night) and cough that began Sunday.  Home interventions: OTC cough/ cold meds

## 2022-11-03 NOTE — ED Provider Notes (Signed)
[facility]    CSN: [id number] Arrival date & time: 11/03/22  1654      History   Chief Complaint Chief Complaint  Patient presents with   Cough   Fever    HPI Jesse Andrews is a 27 y.o. male  presents for evaluation of URI symptoms for 2 days. Patient reports associated symptoms of fever of 101, cough, congestion, sore throat. Denies N/V/D, ear pain, shortness of breath, body aches. Patient does not have a hx of asthma or smoking.  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  Pt has taken cough and cold symptoms OTC for symptoms. Pt has no other concerns at this time.    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 No pertinent surgical history.     Home Medications    Prior to Admission medications   Medication Sig Start Date End Date Taking? Authorizing Provider  promethazine-dextromethorphan (PROMETHAZINE-DM) 6.25-15 MG/5ML syrup Take 5 mLs by mouth 4 (four) times daily as needed for cough. 11/03/22  Yes Melynda Ripple, NP  bictegravir-emtricitabine-tenofovir AF (BIKTARVY) 50-200-25 MG TABS tablet Take 1 tablet by mouth daily. 09/09/21   Esmond Plants, RPH-CPP  mupirocin ointment (BACTROBAN) 2 % Apply 1 application topically 2 (two) times daily. Patient not taking: Reported on 11/06/2021 08/28/21   Robert Bellow, PA-C  oseltamivir (TAMIFLU) 75 MG capsule Take 1 capsule  (75 mg total) by mouth 2 (two) times daily. Patient not taking: Reported on 11/06/2021 09/10/21   Evelina Dun A, FNP  sulfamethoxazole-trimethoprim (BACTRIM) 400-80 MG tablet Take 1 tablet by mouth daily. 10/17/21   Campbell Riches, MD    Family History History reviewed. No pertinent family history.  Social History Social History   Tobacco Use   Smoking status: Never   Smokeless tobacco: Never  Substance Use Topics   Alcohol use: Yes    Alcohol/week: 2.0 standard drinks of alcohol    Types: 2 Cans of beer per week    Comment: occ   Drug use: Yes    Types: Marijuana     Allergies   Penicillin g and Penicillins   Review of Systems Review of Systems  Constitutional:  Positive for fever.  HENT:  Positive for congestion and sore throat.   Respiratory:  Positive for cough.      Physical Exam Triage Vital Signs ED Triage Vitals  Enc Vitals Group     BP 11/03/22 1728 132/83     Pulse Rate 11/03/22 1728 85     Resp 11/03/22 1728 18     Temp 11/03/22 1728 99.6 F (37.6 C)     Temp Source 11/03/22 1728 Oral     SpO2 11/03/22 1728 95 %     Weight --      Height --      Head Circumference --  Peak Flow --      Pain Score 11/03/22 1727 0     Pain Loc --      Pain Edu? --      Excl. in [place]? --    No data found.  Updated Vital Signs BP 132/83 (BP Location: Right Arm)   Pulse 85   Temp 99.6 F (37.6 C) (Oral)   Resp 18   SpO2 95%   Visual Acuity Right Eye Distance:   Left Eye Distance:   Bilateral Distance:    Right Eye Near:   Left Eye Near:    Bilateral Near:     Physical Exam Vitals and nursing note reviewed.  Constitutional:      General: He is not in acute distress.    Appearance: Normal appearance. He is not ill-appearing or toxic-appearing.  HENT:     Head: Normocephalic and atraumatic.     Right Ear: Tympanic membrane and ear canal normal.     Left Ear: Tympanic membrane and ear canal normal.     Nose: Congestion present.      Mouth/Throat:     Mouth: Mucous membranes are moist.     Pharynx: Posterior oropharyngeal erythema present.  Eyes:     Pupils: Pupils are equal, round, and reactive to light.  Cardiovascular:     Rate and Rhythm: Normal rate and regular rhythm.     Heart sounds: Normal heart sounds.  Pulmonary:     Effort: Pulmonary effort is normal.     Breath sounds: Normal breath sounds.  Musculoskeletal:     Cervical back: Normal range of motion and neck supple.  Lymphadenopathy:     Cervical: No cervical adenopathy.  Skin:    General: Skin is warm and dry.  Neurological:     General: No focal deficit present.     Mental Status: He is alert and oriented to person, place, and time.  Psychiatric:        Mood and Affect: Mood normal.        Behavior: Behavior normal.      UC Treatments / Results  Labs (all labs ordered are listed, but only abnormal results are displayed) Labs Reviewed  SARS CORONAVIRUS 2 (TAT 6-24 HRS)  POCT INFLUENZA A/B    EKG   Radiology No results found.  Procedures Procedures (including critical care time)  Medications Ordered in UC Medications - No data to display  Initial Impression / Assessment and Plan / UC Course  I have reviewed the triage vital signs and the nursing notes.  Pertinent labs & imaging results that were available during my care of the patient were reviewed by me and considered in my medical decision making (see chart for details).     Negative rapid flu, COVID PCR and will contact if positive Promethazine DM as needed for cough OTC analgesics for fever reduction as needed Rest and fluids PCP follow-up if symptoms do not improve ER precautions reviewed and patient verbalized understanding Final Clinical Impressions(s) / UC Diagnoses   Final diagnoses:  Fever, unspecified  Viral illness     Discharge Instructions      Promethazine DM as needed for cough.  Please note this medication make you drowsy.  Do not drink alcohol  or drive on this medication Over-the-counter Tylenol or ibuprofen as needed for fever management Rest and fluids Follow-up with your PCP if your symptoms or not improving Please go to the emergency room if you have any worsening symptoms  ED Prescriptions     Medication Sig Dispense Auth. Provider   promethazine-dextromethorphan (PROMETHAZINE-DM) 6.25-15 MG/5ML syrup Take 5 mLs by mouth 4 (four) times daily as needed for cough. 118 mL Melynda Ripple, NP      PDMP not reviewed this encounter.   Melynda Ripple, NP 11/03/22 [phone]

## 2022-11-03 NOTE — Discharge Instructions (Signed)
Promethazine DM as needed for cough.  Please note this medication make you drowsy.  Do not drink alcohol or drive on this medication Over-the-counter Tylenol or ibuprofen as needed for fever management Rest and fluids Follow-up with your PCP if your symptoms or not improving Please go to the emergency room if you have any worsening symptoms

## 2022-11-04 LAB — SARS CORONAVIRUS 2 (TAT 6-24 HRS): SARS Coronavirus 2: NEGATIVE

## 2022-11-25 ENCOUNTER — Encounter: Payer: Managed Care, Other (non HMO) | Admitting: Infectious Diseases

## 2023-09-10 ENCOUNTER — Ambulatory Visit
Admission: EM | Admit: 2023-09-10 | Discharge: 2023-09-10 | Disposition: A | Discharge status: Home / Self Care | Payer: No Typology Code available for payment source | Attending: Family Medicine | Admitting: Family Medicine

## 2023-09-10 ENCOUNTER — Ambulatory Visit (INDEPENDENT_AMBULATORY_CARE_PROVIDER_SITE_OTHER): Payer: No Typology Code available for payment source

## 2023-09-10 ENCOUNTER — Other Ambulatory Visit: Payer: Self-pay

## 2023-09-10 DIAGNOSIS — R051 Acute cough: Secondary | ICD-10-CM

## 2023-09-10 DIAGNOSIS — B349 Viral infection, unspecified: Secondary | ICD-10-CM | POA: Diagnosis not present

## 2023-09-10 LAB — POC COVID19/FLU A&B COMBO
Covid Antigen, POC: NEGATIVE
Influenza A Antigen, POC: NEGATIVE
Influenza B Antigen, POC: NEGATIVE

## 2023-09-10 MED ORDER — PROMETHAZINE-DM 6.25-15 MG/5ML PO SYRP: 5.0000 mL | ORAL_SOLUTION | Freq: Four times a day (QID) | ORAL | 0 refills | Status: DC | PRN

## 2023-09-10 NOTE — ED Triage Notes (Incomplete)
Pt is here with a fever and cough since Wednesday, pt has taken OTC meds to relieve discomfort.

## 2023-09-10 NOTE — ED Provider Notes (Signed)
[facility]    CSN: [id number] Arrival date & time: 09/10/23  1018      History   Chief Complaint Chief Complaint  Patient presents with   Fever   Cough    HPI Jesse Andrews is a 28 y.o. male  presents for evaluation of URI symptoms for 2 days. Patient reports associated symptoms of cough, congestion, fever of 101, vomiting and diarrhea with shortness of breath and fatigue. Denies sore throat, ear pain. Patient does not have a hx of asthma. Patient is not an active smoker.   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  Reports no sick contacts but he did go to [place] recently.  Pt has taken cough medicine OTC for symptoms. Pt has no other concerns at this time.    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 No pertinent surgical history.     Home Medications    Prior to Admission medications   Medication Sig Start Date End Date Taking? Authorizing Provider  promethazine-dextromethorphan (PROMETHAZINE-DM) 6.25-15 MG/5ML syrup Take 5 mLs by mouth 4 (four) times daily as needed for cough. 09/10/23  Yes Radford Pax, NP  bictegravir-emtricitabine-tenofovir AF (BIKTARVY) 50-200-25 MG TABS tablet Take 1 tablet by mouth daily. 09/09/21   Jennette Kettle, RPH-CPP  mupirocin ointment (BACTROBAN) 2 % Apply 1 application topically 2 (two) times daily. Patient not taking: Reported on 11/06/2021 08/28/21   Horton Finer, PA-C  oseltamivir (TAMIFLU) 75 MG  capsule Take 1 capsule (75 mg total) by mouth 2 (two) times daily. Patient not taking: Reported on 11/06/2021 09/10/21   Jannifer Rodney A, FNP  sulfamethoxazole-trimethoprim (BACTRIM) 400-80 MG tablet Take 1 tablet by mouth daily. 10/17/21   Ginnie Smart, MD    Family History History reviewed. No pertinent family history.  Social History Social History   Tobacco Use   Smoking status: Never   Smokeless tobacco: Never  Substance Use Topics   Alcohol use: Yes    Alcohol/week: 2.0 standard drinks of alcohol    Types: 2 Cans of beer per week    Comment: occ   Drug use: Yes    Types: Marijuana     Allergies   Penicillin g and Penicillins   Review of Systems Review of Systems  Constitutional:  Positive for fatigue and fever.  HENT:  Positive for congestion.   Respiratory:  Positive for cough and shortness of breath.      Physical Exam Triage Vital Signs ED Triage Vitals  Encounter Vitals Group     BP 09/10/23 1023 136/69     Systolic BP Percentile --      Diastolic BP Percentile --      Pulse Rate 09/10/23 1023 95     Resp 09/10/23 1023 16     Temp 09/10/23 1023 99.3 F (37.4 C)     Temp Source 09/10/23 1023 Oral     SpO2 09/10/23 1023  98 %     Weight --      Height --      Head Circumference --      Peak Flow --      Pain Score 09/10/23 1021 0     Pain Loc --      Pain Education --      Exclude from Growth Chart --    No data found.  Updated Vital Signs BP 136/69 (BP Location: Left Arm)   Pulse 95   Temp 99.3 F (37.4 C) (Oral)   Resp 16   SpO2 98%   Visual Acuity Right Eye Distance:   Left Eye Distance:   Bilateral Distance:    Right Eye Near:   Left Eye Near:    Bilateral Near:     Physical Exam Vitals and nursing note reviewed.  Constitutional:      General: He is not in acute distress.    Appearance: Normal appearance. He is not ill-appearing or toxic-appearing.  HENT:     Head: Normocephalic and atraumatic.     Right Ear: Tympanic  membrane and ear canal normal.     Left Ear: Tympanic membrane and ear canal normal.     Nose: Congestion present.     Mouth/Throat:     Mouth: Mucous membranes are moist.     Pharynx: No oropharyngeal exudate or posterior oropharyngeal erythema.  Eyes:     Pupils: Pupils are equal, round, and reactive to light.  Cardiovascular:     Rate and Rhythm: Normal rate and regular rhythm.     Heart sounds: Normal heart sounds.  Pulmonary:     Effort: Pulmonary effort is normal.     Breath sounds: Normal breath sounds.  Musculoskeletal:     Cervical back: Normal range of motion and neck supple.  Lymphadenopathy:     Cervical: No cervical adenopathy.  Skin:    General: Skin is warm and dry.  Neurological:     General: No focal deficit present.     Mental Status: He is alert and oriented to person, place, and time.  Psychiatric:        Mood and Affect: Mood normal.        Behavior: Behavior normal.      UC Treatments / Results  Labs (all labs ordered are listed, but only abnormal results are displayed) Labs Reviewed  POC COVID19/FLU A&B COMBO    EKG   Radiology DG Chest 2 View Result Date: 09/10/2023 CLINICAL DATA:  Cough and fever.  HIV. EXAM: CHEST - 2 VIEW COMPARISON:  02/18/2018 FINDINGS: The heart size and mediastinal contours are within normal limits. Both lungs are clear. The visualized skeletal structures are unremarkable. IMPRESSION: No active cardiopulmonary disease. Electronically Signed   By: Danae Orleans M.D.   On: 09/10/2023 11:25    Procedures Procedures (including critical care time)  Medications Ordered in UC Medications - No data to display  Initial Impression / Assessment and Plan / UC Course  I have reviewed the triage vital signs and the nursing notes.  Pertinent labs & imaging results that were available during my care of the patient were reviewed by me and considered in my medical decision making (see chart for details).     Reviewed exam and  symptoms with patient.  No red flags.  Negative rapid flu and COVID.  Chest x-ray negative for pneumonia.  Discussed viral illness and symptomatic treatment.  Promethazine DM as needed for cough, side effect profile reviewed.  Advised PCP follow-up 2 to 3 days for recheck.  ER precautions reviewed. Final Clinical Impressions(s) / UC Diagnoses   Final diagnoses:  Acute cough  Viral illness     Discharge Instructions      Start promethazine DM as needed for cough. Please note this medication can make you drowsy.  Please do not drink alcohol or drive on this medication.  Lots of rest and fluids.  Viruses can last 7 to 10 days on average.  Please follow-up with your PCP in 2 to 3 days for recheck.  Please go to the ER if you develop any worsening symptoms.  I hope you feel better soon!     ED Prescriptions     Medication Sig Dispense Auth. Provider   promethazine-dextromethorphan (PROMETHAZINE-DM) 6.25-15 MG/5ML syrup Take 5 mLs by mouth 4 (four) times daily as needed for cough. 118 mL Radford Pax, NP      PDMP not reviewed this encounter.   Radford Pax, NP 09/10/23 1135

## 2023-09-10 NOTE — Discharge Instructions (Signed)
Start promethazine DM as needed for cough. Please note this medication can make you drowsy.  Please do not drink alcohol or drive on this medication.  Lots of rest and fluids.  Viruses can last 7 to 10 days on average.  Please follow-up with your PCP in 2 to 3 days for recheck.  Please go to the ER if you develop any worsening symptoms.  I hope you feel better soon!

## 2024-03-09 ENCOUNTER — Telehealth: Payer: Self-pay

## 2024-03-09 ENCOUNTER — Other Ambulatory Visit (HOSPITAL_COMMUNITY): Payer: Self-pay

## 2024-03-09 NOTE — Telephone Encounter (Signed)
 Pharmacy Patient Advocate Encounter  Insurance verification completed.   The patient is insured through [employer]   Ran test claim for Biktarvy . Currently a quantity of 30 is a 30 day supply and the co-pay is $0.00 . [place] $0.00  This test claim was processed through [facility]- copay amounts may vary at other pharmacies due to pharmacy/plan contracts, or as the patient moves through the different stages of their insurance plan.

## 2024-03-14 ENCOUNTER — Ambulatory Visit: Admitting: Infectious Diseases

## 2024-03-14 ENCOUNTER — Other Ambulatory Visit: Payer: Self-pay

## 2024-03-14 ENCOUNTER — Ambulatory Visit

## 2024-03-14 ENCOUNTER — Encounter: Payer: Self-pay | Admitting: Infectious Diseases

## 2024-03-14 VITALS — BP 107/68 | HR 103 | Temp 99.7°F | Ht 75.0 in | Wt 140.0 lb

## 2024-03-14 DIAGNOSIS — R197 Diarrhea, unspecified: Secondary | ICD-10-CM

## 2024-03-14 DIAGNOSIS — B20 Human immunodeficiency virus [HIV] disease: Secondary | ICD-10-CM | POA: Diagnosis present

## 2024-03-14 DIAGNOSIS — B370 Candidal stomatitis: Secondary | ICD-10-CM | POA: Diagnosis not present

## 2024-03-14 MED ORDER — SULFAMETHOXAZOLE-TRIMETHOPRIM 800-160 MG PO TABS: 1.0000 | ORAL_TABLET | ORAL | 5 refills | Status: AC

## 2024-03-14 MED ORDER — FLUCONAZOLE 200 MG PO TABS: 200.0000 mg | ORAL_TABLET | Freq: Every day | ORAL | 0 refills | Status: AC

## 2024-03-14 MED ORDER — AZITHROMYCIN 600 MG PO TABS: 1200.0000 mg | ORAL_TABLET | ORAL | 0 refills | Status: AC

## 2024-03-14 MED ORDER — BICTEGRAVIR-EMTRICITAB-TENOFOV 50-200-25 MG PO TABS
1.0000 | ORAL_TABLET | Freq: Every day | ORAL | 5 refills | Status: AC
Start: 2024-03-14 — End: ?

## 2024-03-14 NOTE — Progress Notes (Signed)
 Name: Jesse Andrews  DOB: 1996/05/16 MRN: [id number] PCP: Pcp, No    Brief Narrative:  Jesse Andrews is a 28 y.o. male with HIV, Stage 3, diagnosed in 2015. CD4 nadir <70 VL unknown Transmission Risk: MSM History of OIs: PCP 02/2018 History of STIs: none Hep B sAg (- 2019), sAb (), cAb (); Hep A (), Hep C (- 2019) Quantiferon () HLA B*5701 () G6PD: ()   Previous Regimens: Biktarvy , intermittent adherence 2020 - 2025  Genotypes: 2022 - no resistance noted   Subjective  Chief Complaint  Patient presents with   Follow-up    Returning to care Off meds since 2023 Diarrhea for the last few weeks Unintentional weight loss     Discussed the use of AI scribe software for clinical note transcription with the patient, who gave verbal consent to proceed.  History of Present Illness   Jesse Andrews is a 28 year old male with HIV who presents with weight loss and diarrhea.  He has experienced significant weight loss of approximately 35 pounds over the last couple of months, which he attributes to being off his HIV medication, Biktarvy , since March 2023. Despite the weight loss, he maintains a normal appetite and has access to sufficient food resources.  He experiences frequent diarrhea, described as loose, watery stools, which varies in frequency depending on his diet. No blood or mucus is present in the stools. He has not experienced any fevers, chills, or sweats.   He has noticed oral thrush, described as 'white stuff' in his mouth, but denies any pain while swallowing. He has a history of severe pneumonia six years ago but has not been hospitalized since then.  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 He lives in [place] with a roommate and prefers to pick up his medications from a local pharmacy.  No headaches, vision changes, rashes, or  nausea, but he reports feeling extremely fatigued, which impacts his daily activities. 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  His current medications include Biktarvy  for HIV, which he has tolerated well in the past without side effects, and Bactrim , which he previously took on Monday, Wednesday, and Friday to prevent infections. He has Medicaid coverage, which he is checking to ensure it covers his medications.          03/14/2024    1:50 PM  Depression screen PHQ 2/9  Decreased Interest 0  Down, Depressed, Hopeless 0  PHQ - 2 Score 0  Altered sleeping 1  Tired, decreased energy 1  Change in appetite 0  Feeling bad or failure about yourself  0  Trouble concentrating 0  Moving slowly or fidgety/restless 0  Suicidal thoughts 0  PHQ-9 Score 2  Difficult doing work/chores Not difficult at all    Review of Systems  Constitutional:  Positive for malaise/fatigue and weight loss. Negative for chills and fever.  HENT:  Negative for sore throat.   Respiratory:  Negative for cough, sputum production and shortness of breath.   Gastrointestinal:  Positive for abdominal pain. Negative for blood in stool, diarrhea, heartburn, nausea and vomiting.  Genitourinary: Negative.   Musculoskeletal: Negative.   Skin:  Negative for rash.  Neurological:  Negative for dizziness and headaches.    Past Medical History:  Diagnosis Date   Chlamydia 2014   Gonorrhea 2014   HIV  disease ([facility]) 2015   Syphilis     Outpatient Medications Prior to Visit  Medication Sig Dispense Refill   mupirocin  ointment (BACTROBAN ) 2 % Apply 1 application topically 2 (two) times daily. (Patient not taking: Reported on 11/06/2021) 22 g 0   oseltamivir  (TAMIFLU ) 75 MG capsule Take 1 capsule (75 mg total) by mouth 2 (two) times daily. (Patient not taking: Reported on 11/06/2021) 10 capsule 0   promethazine -dextromethorphan (PROMETHAZINE -DM) 6.25-15 MG/5ML syrup Take 5 mLs by  mouth 4 (four) times daily as needed for cough. 118 mL 0   bictegravir-emtricitabine -tenofovir  AF (BIKTARVY ) 50-200-25 MG TABS tablet Take 1 tablet by mouth daily. 30 tablet 5   sulfamethoxazole -trimethoprim  (BACTRIM ) 400-80 MG tablet Take 1 tablet by mouth daily. 30 tablet 5   No facility-administered medications prior to visit.     Allergies  Allergen Reactions   Penicillin G    Penicillins     Childhood allergy, doesn't know    Social History   Tobacco Use   Smoking status: Never   Smokeless tobacco: Never  Substance Use Topics   Alcohol use: Yes    Alcohol/week: 2.0 standard drinks of alcohol    Types: 2 Cans of beer per week    Comment: socially   Drug use: Not Currently    Types: Marijuana    No family history on file.  Social History   Substance and Sexual Activity  Sexual Activity Not on file   Comment: declined condoms        Objective  Vitals:   03/14/24 1342  BP: (!) 94/58  Pulse: (!) 106  Temp: 99.7 F (37.6 C)  TempSrc: Oral  SpO2: 99%  Weight: 140 lb (63.5 kg)  Height: 6' 3 (1.905 m)   Body mass index is 17.5 kg/m.  Physical Exam Vitals reviewed.  Constitutional:      Appearance: He is ill-appearing.  HENT:     Mouth/Throat:     Mouth: Mucous membranes are moist.     Pharynx: No oropharyngeal exudate.     Comments: White patches and erythema noted to soft palate c/w thrush  Eyes:     Pupils: Pupils are equal, round, and reactive to light.  Cardiovascular:     Rate and Rhythm: Tachycardia present.  Pulmonary:     Effort: Pulmonary effort is normal.  Musculoskeletal:     Cervical back: No tenderness.  Lymphadenopathy:     Cervical: No cervical adenopathy.  Skin:    General: Skin is warm and dry.     Capillary Refill: Capillary refill takes less than 2 seconds.  Neurological:     Mental Status: He is alert and oriented to person, place, and time.  Psychiatric:        Mood and Affect: Mood normal.       Assessment and  Plan    Uncontrolled HIV infection/AIDS with associated oral candidiasis, chronic diarrhea, unintentional weight loss, and fatigue - Uncontrolled HIV infection with significant weight loss of approximately 35 pounds over the last few months, diarrhea, and fatigue. Oral candidiasis noted on soft palate in mouth without significant pain. No current antiretroviral therapy since March 2023. Previous treatment with Biktarvy  was well-tolerated without side effects. Chronic diarrhea likely all due to advanced HIV, but stool sample will be collected to rule out other causes - kit provided today for PCR testing. Fortunately no blood or mucus in stool. Fatigue and weight loss likely related to uncontrolled HIV and nutritional deficiencies. Discussed the importance of resuming antiretroviral  therapy to improve immune function and overall health. Explained potential immune reconstitution  syndrome and advised to report any new or worsening symptoms. Fortunately no headaches / dizziness or visual symptoms to report.  - Restart Biktarvy  once daily. - Prescribe fluconazole  for oral candidiasis, one tablet daily for two weeks. - Provide stool sample kit for diarrhea evaluation. - Order blood work to check electrolytes, kidney, and liver function, and viral load. - Encourage high-calorie diet to address weight loss. He has good appetite so will avoid stimulant now.  - AFB blood culture ordered with abdominal pain and diarrhea - Advise to report any new or worsening symptoms such as swelling, lymphadenopathy, fevers, chills, or skin rashes. - Start azithromycin  1200 mg once weekly for MAC prevention   Pneumocystis pneumonia prophylaxis - Severe pneumonia six years ago. Prophylaxis with Bactrim  was previously used and will be restarted to prevent opportunistic infections due to low CD4 count and weakened immune system. - Restart Bactrim  prophylaxis, to be taken Monday, Wednesday, and Friday.       Orders Placed  This Encounter  Procedures   AFB culture, blood   HIV RNA, RTPCR W/R GT (RTI, PI,INT)   T-helper cells (CD4) count   RPR   COMPLETE METABOLIC PANEL WITHOUT GFR   CBC w/Diff   Magnesium    Meds ordered this encounter  Medications   sulfamethoxazole -trimethoprim  (BACTRIM  DS) 800-160 MG tablet    Sig: Take 1 tablet by mouth 3 (three) times a week.    Dispense:  12 tablet    Refill:  5   bictegravir-emtricitabine -tenofovir  AF (BIKTARVY ) 50-200-25 MG TABS tablet    Sig: Take 1 tablet by mouth daily. Try to take at the same time each day with or without food.    Dispense:  30 tablet    Refill:  5   fluconazole  (DIFLUCAN ) 200 MG tablet    Sig: Take 1 tablet (200 mg total) by mouth daily.    Dispense:  14 tablet    Refill:  0    Return in about 4 weeks (around 04/11/2024).  Total encounter time - 45 min 26 minutes face to face 19 minutes reviewing chart records pertinent to ID care from hospital stay in 2019 and visits between 2020 and 2023 when he last was seen.   Corean Fireman, MSN, NP-C [facility]  [email or web address] Pager: [phone] Office: [phone] [facility] Main Line: [phone] *Secure Chat Communication Welcome

## 2024-03-14 NOTE — Patient Instructions (Addendum)
 Please start back your Biktarvy  once a day.   Biktarvy  - Common side effects for a short time frame usually include headaches, nausea and diarrhea - OK to take over the counter tylenol  for headaches and imodium for diarrhea - Try taking with food if you are nauseated  - If you take any multivitamins or supplements please separate them from your Biktarvy  by 6 hours before and after.  The main thing is do not have them in the stomach at the same time.  For the diarrhea if you could drop off a same day stool sample for testing that would be helpful to make sure we are not missing anything.   For the thrush - start fluconazole  once a day for 2 weeks   For prevention start the bactrim  antibiotic back every Monday, Wednesday and Friday

## 2024-03-14 NOTE — Addendum Note (Signed)
 Addended by: MELVENIA COREAN SAILOR on: 03/14/2024 02:36 PM   Modules accepted: Orders

## 2024-03-15 ENCOUNTER — Other Ambulatory Visit

## 2024-03-15 ENCOUNTER — Other Ambulatory Visit: Payer: Self-pay

## 2024-03-15 DIAGNOSIS — R197 Diarrhea, unspecified: Secondary | ICD-10-CM

## 2024-03-15 LAB — T-HELPER CELLS (CD4) COUNT (NOT AT ARMC)
CD4 % Helper T Cell: 3 % — ABNORMAL LOW (ref 33–65)
CD4 T Cell Abs: <35 /uL — ABNORMAL LOW (ref 400–1790)

## 2024-03-16 ENCOUNTER — Ambulatory Visit: Payer: Self-pay | Admitting: Infectious Diseases

## 2024-03-16 DIAGNOSIS — B20 Human immunodeficiency virus [HIV] disease: Secondary | ICD-10-CM

## 2024-03-16 DIAGNOSIS — R197 Diarrhea, unspecified: Secondary | ICD-10-CM

## 2024-03-17 MED ORDER — POTASSIUM CHLORIDE CRYS ER 20 MEQ PO TBCR: 20.0000 meq | EXTENDED_RELEASE_TABLET | Freq: Every day | ORAL | 0 refills | Status: AC

## 2024-03-20 ENCOUNTER — Ambulatory Visit: Payer: Self-pay | Admitting: Infectious Diseases

## 2024-03-20 LAB — GASTROINTESTINAL PATHOGEN PNL

## 2024-03-23 MED ORDER — CIPROFLOXACIN HCL 750 MG PO TABS: 750.0000 mg | ORAL_TABLET | Freq: Two times a day (BID) | ORAL | 0 refills | Status: AC

## 2024-03-23 NOTE — Progress Notes (Signed)
 Please call Jesse Andrews to see how he is feeling - the blood culture I grew picked up a gram negative rod on the AFB bottle - It may be transient in the setting of diarrhea - if he is feeling worse, I think we should advise he go to the ER for IV antibiotics and assessment. If he feels unchanged, I would like to start him on Ciprofloxacin  750 mg BID - would like to give 2 weeks for him.  We also need to add the order for aerobic/anaerobic blood cultures to see if we can identify that - I can try to put the order in but can we also include [name] in the conversation to make sure it is added correctly? Not sure how often she checks her Epic box.  PLEASE CALL BACK IF YOU WANT TO ADD TEST CODE 392 FOR AROBIC ID AND SENSI was on the report   Thanks team!

## 2024-03-23 NOTE — Telephone Encounter (Signed)
 Spoke with patient regarding culture results. States he feels about the same with no real changes in symptoms. Understands that staff will send prescription for oral antibiotics to [facility]. Also understands if his symptoms worsen to go to ED for evaluation and possible IV antibiotics. Scheduled for lab appointment on 8/13. No questions at this time. Lorenda CHRISTELLA Code, RMA

## 2024-03-23 NOTE — Addendum Note (Signed)
 Addended by: Shamell Suarez M on: 03/23/2024 08:55 AM   Modules accepted: Orders

## 2024-03-23 NOTE — Addendum Note (Signed)
 Addended by: MELVENIA COREAN SAILOR on: 03/23/2024 08:37 AM   Modules accepted: Orders

## 2024-03-23 NOTE — Telephone Encounter (Signed)
-----   Message from [place] sent at 03/23/2024  8:36 AM EDT ----- Please call Jesse Andrews to see how he is feeling - the blood culture I grew picked up a gram negative rod on the AF[employer] bottle - It may be transient in the setting of diarrhea - if he is feeling worse, I  think we should advise he go to the ER for IV antibiotics and assessment. If he feels unchanged, I would like to start him on Ciprofloxacin  750 mg BID - would like to give 2 weeks for him.  We also need to add the order for aerobic/anaerobic blood cultures to see if we can identify that - I can try to put the order in but can we also include Jesse Andrews in the conversation to make sure it is  added correctly? Not sure how often she checks her Epic box.  PLEASE CALL BACK IF YOU WANT TO ADD TEST CODE 392 FOR AROBIC ID AND SENSI was on the report   Thanks team! ----- Message ----- From: Interface, [facility] Lab Results In Sent: 03/14/2024  10:55 PM EDT To: Jesse LOISE Fireman, NP

## 2024-03-29 ENCOUNTER — Other Ambulatory Visit

## 2024-03-29 NOTE — Telephone Encounter (Signed)
 Called Jesse Andrews to follow up, no answer. Left HIPAA compliant voicemail requesting callback.   Carey Johndrow, BSN, RN

## 2024-03-29 NOTE — Telephone Encounter (Signed)
 Patient called back; does feel a little better today. Has not started Cipro  but will pick this up at the pharmacy today. Understands if he starts feeling worse to go the hospital for IV antibiotics. Lorenda CHRISTELLA Code, RMA

## 2024-03-31 ENCOUNTER — Encounter: Payer: Self-pay | Admitting: Infectious Diseases

## 2024-03-31 ENCOUNTER — Other Ambulatory Visit: Payer: Self-pay

## 2024-03-31 ENCOUNTER — Ambulatory Visit (INDEPENDENT_AMBULATORY_CARE_PROVIDER_SITE_OTHER): Admitting: Infectious Diseases

## 2024-03-31 VITALS — BP 121/81 | HR 79 | Temp 98.1°F | Ht 75.0 in | Wt 149.0 lb

## 2024-03-31 DIAGNOSIS — R197 Diarrhea, unspecified: Secondary | ICD-10-CM

## 2024-03-31 NOTE — Patient Instructions (Addendum)
 Cipro  antibiotic twice a day for 2 weeks   Bactrim  (Monday/Wednesday/Friday pill) will continue for now.   Please reschedule the appointment on your birthday for a month from now

## 2024-03-31 NOTE — Progress Notes (Unsigned)
 Name: Jesse Andrews  DOB: 07-06-1996 MRN: [id number] PCP: Pcp, No    Brief Narrative:  Jesse Andrews is a 28 y.o. male with HIV, Stage 3, diagnosed in 2015. CD4 nadir <70 VL unknown Transmission Risk: MSM History of OIs: PCP 02/2018 History of STIs: none Hep B sAg (- 2019), sAb (), cAb (); Hep A (), Hep C (- 2019) Quantiferon () HLA B*5701 () G6PD: ()   Previous Regimens: Biktarvy , intermittent adherence 2020 - 2025  Genotypes: 2022 - no resistance noted   Subjective  Chief Complaint  Patient presents with  . Follow-up     Discussed the use of AI scribe software for clinical note transcription with the patient, who gave verbal consent to proceed.  History of Present Illness              03/14/2024    1:50 PM  Depression screen PHQ 2/9  Decreased Interest 0  Down, Depressed, Hopeless 0  PHQ - 2 Score 0  Altered sleeping 1  Tired, decreased energy 1  Change in appetite 0  Feeling bad or failure about yourself  0  Trouble concentrating 0  Moving slowly or fidgety/restless 0  Suicidal thoughts 0  PHQ-9 Score 2  Difficult doing work/chores Not difficult at all    Review of Systems  Constitutional:  Positive for malaise/fatigue and weight loss. Negative for chills and fever.  HENT:  Negative for sore throat.   Respiratory:  Negative for cough, sputum production and shortness of breath.   Gastrointestinal:  Positive for abdominal pain. Negative for blood in stool, diarrhea, heartburn, nausea and vomiting.  Genitourinary: Negative.   Musculoskeletal: Negative.   Skin:  Negative for rash.  Neurological:  Negative for dizziness and headaches.    Past Medical History:  Diagnosis Date  . Chlamydia 2014  . Gonorrhea 2014  . HIV disease (HCC) 2015  . Syphilis     Outpatient Medications Prior to Visit  Medication Sig Dispense Refill  . azithromycin  (ZITHROMAX ) 600 MG tablet Take 2 tablets (1,200 mg total) by mouth once a week. 24 tablet 0  .  bictegravir-emtricitabine -tenofovir  AF (BIKTARVY ) 50-200-25 MG TABS tablet Take 1 tablet by mouth daily. Try to take at the same time each day with or without food. 30 tablet 5  . ciprofloxacin  (CIPRO ) 750 MG tablet Take 1 tablet (750 mg total) by mouth 2 (two) times daily for 14 days. 28 tablet 0  . sulfamethoxazole -trimethoprim  (BACTRIM  DS) 800-160 MG tablet Take 1 tablet by mouth 3 (three) times a week. 12 tablet 5  . fluconazole  (DIFLUCAN ) 200 MG tablet Take 1 tablet (200 mg total) by mouth daily. 14 tablet 0  . mupirocin  ointment (BACTROBAN ) 2 % Apply 1 application topically 2 (two) times daily. (Patient not taking: Reported on 11/06/2021) 22 g 0  . oseltamivir  (TAMIFLU ) 75 MG capsule Take 1 capsule (75 mg total) by mouth 2 (two) times daily. (Patient not taking: Reported on 11/06/2021) 10 capsule 0  . potassium chloride  SA (KLOR-CON  M) 20 MEQ tablet Take 1 tablet (20 mEq total) by mouth daily for 7 days. 7 tablet 0  . promethazine -dextromethorphan (PROMETHAZINE -DM) 6.25-15 MG/5ML syrup Take 5 mLs by mouth 4 (four) times daily as needed for cough. 118 mL 0   No facility-administered medications prior to visit.     Allergies  Allergen Reactions  . Penicillin G   . Penicillins     Childhood allergy, doesn't know    Social History   Tobacco Use  .  Smoking status: Never  . Smokeless tobacco: Never  Substance Use Topics  . Alcohol use: Yes    Alcohol/week: 2.0 standard drinks of alcohol    Types: 2 Cans of beer per week    Comment: socially  . Drug use: Not Currently    Types: Marijuana    No family history on file.  Social History   Substance and Sexual Activity  Sexual Activity Not on file   Comment: declined condoms        Objective  Vitals:   03/31/24 0949  BP: 121/81  Pulse: 79  Temp: 98.1 F (36.7 C)  TempSrc: Temporal  SpO2: 98%  Weight: 149 lb (67.6 kg)  Height: 6' 3 (1.905 m)   Body mass index is 18.62 kg/m.  Physical Exam Vitals reviewed.   Constitutional:      Appearance: He is ill-appearing.  HENT:     Mouth/Throat:     Mouth: Mucous membranes are moist.     Pharynx: No oropharyngeal exudate.     Comments: White patches and erythema noted to soft palate c/w thrush  Eyes:     Pupils: Pupils are equal, round, and reactive to light.  Cardiovascular:     Rate and Rhythm: Tachycardia present.  Pulmonary:     Effort: Pulmonary effort is normal.  Musculoskeletal:     Cervical back: No tenderness.  Lymphadenopathy:     Cervical: No cervical adenopathy.  Skin:    General: Skin is warm and dry.     Capillary Refill: Capillary refill takes less than 2 seconds.  Neurological:     Mental Status: He is alert and oriented to person, place, and time.  Psychiatric:        Mood and Affect: Mood normal.       Assessment and Plan    Uncontrolled HIV infection/AIDS with associated oral candidiasis, chronic diarrhea, unintentional weight loss, and fatigue - Uncontrolled HIV infection with significant weight loss of approximately 35 pounds over the last few months, diarrhea, and fatigue. Oral candidiasis noted on soft palate in mouth without significant pain. No current antiretroviral therapy since March 2023. Previous treatment with Biktarvy  was well-tolerated without side effects. Chronic diarrhea likely all due to advanced HIV, but stool sample will be collected to rule out other causes - kit provided today for PCR testing. Fortunately no blood or mucus in stool. Fatigue and weight loss likely related to uncontrolled HIV and nutritional deficiencies. Discussed the importance of resuming antiretroviral therapy to improve immune function and overall health. Explained potential immune reconstitution  syndrome and advised to report any new or worsening symptoms. Fortunately no headaches / dizziness or visual symptoms to report.  - Restart Biktarvy  once daily. - Prescribe fluconazole  for oral candidiasis, one tablet daily for two  weeks. - Provide stool sample kit for diarrhea evaluation. - Order blood work to check electrolytes, kidney, and liver function, and viral load. - Encourage high-calorie diet to address weight loss. He has good appetite so will avoid stimulant now.  - AFB blood culture ordered with abdominal pain and diarrhea - Advise to report any new or worsening symptoms such as swelling, lymphadenopathy, fevers, chills, or skin rashes. - Start azithromycin  1200 mg once weekly for MAC prevention   Pneumocystis pneumonia prophylaxis - Severe pneumonia six years ago. Prophylaxis with Bactrim  was previously used and will be restarted to prevent opportunistic infections due to low CD4 count and weakened immune system. - Restart Bactrim  prophylaxis, to be taken Monday, Wednesday, and Friday.  No orders of the defined types were placed in this encounter.   No orders of the defined types were placed in this encounter.   No follow-ups on file.  Total encounter time - 45 min 26 minutes face to face 19 minutes reviewing chart records pertinent to ID care from hospital stay in 2019 and visits between 2020 and 2023 when he last was seen.   Corean Fireman, MSN, NP-C [facility] for Infectious Disease [facility]  [email or web address] Pager: [phone] Office: [phone] [facility] Main Line: [phone] *Secure Chat Communication Welcome

## 2024-04-01 ENCOUNTER — Ambulatory Visit: Payer: Self-pay | Admitting: Infectious Diseases

## 2024-04-01 LAB — COMPREHENSIVE METABOLIC PANEL WITH GFR
AG Ratio: 1 (calc) (ref 1.0–2.5)
ALT: 17 U/L (ref 9–46)
AST: 17 U/L (ref 10–40)
Albumin: 3.6 g/dL (ref 3.6–5.1)
Alkaline phosphatase (APISO): 76 U/L (ref 36–130)
BUN: 8 mg/dL (ref 7–25)
CO2: 28 mmol/L (ref 20–32)
Calcium: 9.5 mg/dL (ref 8.6–10.3)
Chloride: 103 mmol/L (ref 98–110)
Creat: 0.7 mg/dL (ref 0.60–1.24)
Globulin: 3.7 g/dL (ref 1.9–3.7)
Glucose, Bld: 104 mg/dL — ABNORMAL HIGH (ref 65–99)
Potassium: 4.5 mmol/L (ref 3.5–5.3)
Sodium: 138 mmol/L (ref 135–146)
Total Bilirubin: 0.4 mg/dL (ref 0.2–1.2)
Total Protein: 7.3 g/dL (ref 6.1–8.1)
eGFR: 128 mL/min/1.73m2 (ref >=60)

## 2024-04-11 ENCOUNTER — Ambulatory Visit: Admitting: Infectious Diseases

## 2024-04-27 LAB — CBC WITH DIFFERENTIAL/PLATELET
Absolute Lymphocytes: 865 {cells}/uL (ref 850–3900)
Absolute Monocytes: 566 {cells}/uL (ref 200–950)
Basophils Absolute: 8 {cells}/uL (ref 0–200)
Basophils Relative: 0.2 %
Eosinophils Absolute: 21 {cells}/uL (ref 15–500)
Eosinophils Relative: 0.5 %
HCT: 40.5 % (ref 38.5–50.0)
Hemoglobin: 13.8 g/dL (ref 13.2–17.1)
MCH: 29.7 pg (ref 27.0–33.0)
MCHC: 34.1 g/dL (ref 32.0–36.0)
MCV: 87.3 fL (ref 80.0–100.0)
MPV: 9.3 fL (ref 7.5–12.5)
Monocytes Relative: 13.8 %
Neutro Abs: 2640 {cells}/uL (ref 1500–7800)
Neutrophils Relative %: 64.4 %
Platelets: 222 Thousand/uL (ref 140–400)
RBC: 4.64 Million/uL (ref 4.20–5.80)
RDW: 13.1 % (ref 11.0–15.0)
Total Lymphocyte: 21.1 %
WBC: 4.1 Thousand/uL (ref 3.8–10.8)

## 2024-04-27 LAB — AEROBIC BACTERIUM IDENTIFICATION AND SUSCEPTIBILITY
MICRO NUMBER:: 16800487
SPECIMEN QUALITY: ADEQUATE

## 2024-04-27 LAB — COMPLETE METABOLIC PANEL WITHOUT GFR
AG Ratio: 0.9 (calc) — ABNORMAL LOW (ref 1.0–2.5)
ALT: 20 U/L (ref 9–46)
AST: 19 U/L (ref 10–40)
Albumin: 3.7 g/dL (ref 3.6–5.1)
Alkaline phosphatase (APISO): 53 U/L (ref 36–130)
BUN: 7 mg/dL (ref 7–25)
CO2: 29 mmol/L (ref 20–32)
Calcium: 9.6 mg/dL (ref 8.6–10.3)
Chloride: 97 mmol/L — ABNORMAL LOW (ref 98–110)
Creat: 0.68 mg/dL (ref 0.60–1.24)
Globulin: 4.3 g/dL — ABNORMAL HIGH (ref 1.9–3.7)
Glucose, Bld: 87 mg/dL (ref 65–99)
Potassium: 3 mmol/L — ABNORMAL LOW (ref 3.5–5.3)
Sodium: 136 mmol/L (ref 135–146)
Total Bilirubin: 0.7 mg/dL (ref 0.2–1.2)
Total Protein: 8 g/dL (ref 6.1–8.1)

## 2024-04-27 LAB — MAGNESIUM: Magnesium: 1.9 mg/dL (ref 1.5–2.5)

## 2024-04-27 LAB — AFB CULTURE, BLOOD
MICRO NUMBER:: 16764309
SPECIMEN QUALITY:: ADEQUATE

## 2024-04-27 LAB — TEST AUTHORIZATION: TEST CODE:: 392

## 2024-04-27 LAB — HIV RNA, RTPCR W/R GT (RTI, PI,INT)
HIV 1 RNA Quant: 63600 {copies}/mL — ABNORMAL HIGH
HIV-1 RNA Quant, Log: 4.8 {Log_copies}/mL — ABNORMAL HIGH

## 2024-04-27 LAB — HIV-1 INTEGRASE GENOTYPE

## 2024-04-27 LAB — HIV-1 GENOTYPE: HIV-1 Genotype: DETECTED — AB

## 2024-04-27 LAB — RPR TITER: RPR Titer: 1:4 {titer} — ABNORMAL HIGH

## 2024-04-27 LAB — RPR: RPR Ser Ql: REACTIVE — AB

## 2024-04-27 LAB — T PALLIDUM AB: T Pallidum Abs: POSITIVE — AB

## 2024-05-05 ENCOUNTER — Ambulatory Visit (INDEPENDENT_AMBULATORY_CARE_PROVIDER_SITE_OTHER): Admitting: Infectious Diseases

## 2024-05-05 ENCOUNTER — Other Ambulatory Visit: Payer: Self-pay

## 2024-05-05 ENCOUNTER — Encounter: Payer: Self-pay | Admitting: Infectious Diseases

## 2024-05-05 VITALS — BP 118/75 | HR 63 | Temp 98.5°F | Resp 16 | Wt 158.8 lb

## 2024-05-05 DIAGNOSIS — B20 Human immunodeficiency virus [HIV] disease: Secondary | ICD-10-CM | POA: Diagnosis present

## 2024-05-05 DIAGNOSIS — Z23 Encounter for immunization: Secondary | ICD-10-CM | POA: Diagnosis not present

## 2024-05-05 DIAGNOSIS — G4726 Circadian rhythm sleep disorder, shift work type: Secondary | ICD-10-CM

## 2024-05-05 NOTE — Progress Notes (Signed)
 Name: Jesse Andrews  DOB: Jan 14, 1996 MRN: [id number] PCP: Pcp, No    Brief Narrative:  Jesse Andrews is a 28 y.o. male with HIV, Stage 3, diagnosed in 2015. CD4 nadir <70 VL unknown Transmission Risk: MSM History of OIs: PCP 02/2018 History of STIs: none Hep B sAg (- 2019), sAb (), cAb (); Hep A (), Hep C (- 2019) Quantiferon () HLA B*5701 () G6PD: ()   Previous Regimens: Biktarvy , intermittent adherence 2020 - 2025  Genotypes: 2022 - no resistance noted     Subjective  Chief Complaint  Patient presents with   Follow-up    B20 -      Discussed the use of AI scribe software for clinical note transcription with the patient, who gave verbal consent to proceed.  History of Present Illness   Jesse Andrews is a 28 year old male with HIV who presents for routine follow-up care.  He is here for a routine follow-up visit after an acute Shigella infection that resolved in August 2025. He feels good with no current concerns or worries. He continues on prophylactic azithromycin  once a week and Bactrim  for infection prevention. He is also on Biktarvy  once daily for HIV treatment. He completed a course of Diflucan  earlier this year for oral thrush.  His weight has increased from 140 pounds in July to 158 pounds currently. 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  He is not interested in receiving a flu shot today but is open to completing his HPV vaccine series, having received one dose in June 2022. He does not recall any side effects from the previous dose.          05/05/2024   10:12 AM  Depression screen PHQ 2/9  Decreased Interest 0  Down, Depressed, Hopeless 0  PHQ - 2 Score 0    Review of Systems  Constitutional:  Negative for chills, fever, malaise/fatigue and weight loss.  HENT:  Negative for sore throat.   Respiratory:  Negative for cough, sputum production and shortness of  breath.   Gastrointestinal:  Negative for abdominal pain, blood in stool, constipation, diarrhea, heartburn, nausea and vomiting.  Genitourinary: Negative.   Musculoskeletal: Negative.   Skin:  Negative for rash.  Neurological:  Negative for dizziness and headaches.    Past Medical History:  Diagnosis Date   Chlamydia 2014   Gonorrhea 2014   HIV disease (HCC) 2015   Syphilis     Outpatient Medications Prior to Visit  Medication Sig Dispense Refill   bictegravir-emtricitabine -tenofovir  AF (BIKTARVY ) 50-200-25 MG TABS tablet Take 1 tablet by mouth daily. Try to take at the same time each day with or without food. 30 tablet 5   sulfamethoxazole -trimethoprim  (BACTRIM  DS) 800-160 MG tablet Take 1 tablet by mouth 3 (three) times a week. 12 tablet 5   azithromycin  (ZITHROMAX ) 600 MG tablet Take 2 tablets (1,200 mg total) by mouth once a week. 24 tablet 0   fluconazole  (DIFLUCAN ) 200 MG tablet Take 1 tablet (200 mg total) by mouth daily. (Patient not taking: Reported on 05/05/2024) 14 tablet 0   mupirocin  ointment (BACTROBAN ) 2 % Apply 1 application topically 2 (two) times daily. (Patient not taking: Reported on 05/05/2024) 22 g 0   potassium chloride  SA (KLOR-CON  M) 20 MEQ tablet Take 1 tablet (20 mEq total) by mouth daily for 7 days. (Patient not taking: Reported on 05/05/2024) 7 tablet 0   oseltamivir  (TAMIFLU ) 75  MG capsule Take 1 capsule (75 mg total) by mouth 2 (two) times daily. (Patient not taking: Reported on 11/06/2021) 10 capsule 0   promethazine -dextromethorphan (PROMETHAZINE -DM) 6.25-15 MG/5ML syrup Take 5 mLs by mouth 4 (four) times daily as needed for cough. 118 mL 0   No facility-administered medications prior to visit.     Allergies  Allergen Reactions   Penicillin G    Penicillins     Childhood allergy, doesn't know    Social History   Tobacco Use   Smoking status: Never   Smokeless tobacco: Never  Substance Use Topics   Alcohol use: Yes    Alcohol/week: 2.0 standard  drinks of alcohol    Types: 2 Cans of beer per week    Comment: socially   Drug use: Not Currently    Types: Marijuana     Social History   Substance and Sexual Activity  Sexual Activity Not on file   Comment: declined condoms        Objective  Vitals:   05/05/24 1010 05/05/24 1011  BP:  118/75  Pulse:  63  Resp:  16  Temp:  98.5 F (36.9 C)  TempSrc:  Oral  SpO2:  98%  Weight: 158 lb 12.8 oz (72 kg)    Body mass index is 19.85 kg/m.  Physical Exam Constitutional:      Appearance: Normal appearance. He is not ill-appearing.  HENT:     Head: Normocephalic.     Mouth/Throat:     Mouth: Mucous membranes are moist.     Pharynx: Oropharynx is clear.  Eyes:     General: No scleral icterus. Cardiovascular:     Rate and Rhythm: Normal rate and regular rhythm.  Pulmonary:     Effort: Pulmonary effort is normal.  Abdominal:     General: Bowel sounds are normal. There is no distension.     Palpations: Abdomen is soft.  Musculoskeletal:        General: Normal range of motion.     Cervical back: Normal range of motion.  Skin:    Coloration: Skin is not jaundiced or pale.  Neurological:     Mental Status: He is alert and oriented to person, place, and time.  Psychiatric:        Mood and Affect: Mood normal.        Judgment: Judgment normal.       Assessment and Plan    Human immunodeficiency virus (HIV) infection - AIDS, CD4 < 50 -  HIV infection is managed with Biktarvy . He is tolerating this medication well without side effects. Adherence sounds good. 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 Has put on nearly 20# since July and has improved energy. No recurrent thrush. Clinically seems he is doing well.  - Continue Biktarvy  daily - Order CD4 count and viral load - Hopeful we can stop the azithromycin  for him  - HPV #2 today, final dose in 6m - He will consider flu vaccine for the season   Prophylaxis -  Prophylactic azithromycin  and Bactrim   are used due to immunosuppression. Azithromycin  will be discontinued once CD4 count reaches at least 50. 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 Non-pharmacological interventions discussed to improve sleep quality. - Recommend earplugs and blackout curtains - Suggest using a white noise machine / ambient noise to help cover up  noise outside the home.   General Health Maintenance - Discussed completing HPV vaccine series to protect against strains not yet exposed to. HPV can cause genital warts and potentially cancer. - Administer second dose of HPV vaccine - Complete dental paperwork for routine care            Orders Placed This Encounter  Procedures   HIV 1 RNA quant-no reflex-bld   T-helper cells (CD4) count    No orders of the defined types were placed in this encounter.   Return in about 3 months (around 08/04/2024).   Corean Fireman, MSN, NP-C [facility] for Infectious Disease [facility]  [email or web address] Pager: [phone] Office: [phone] [facility] Main Line: [phone] *Secure Chat Communication Welcome

## 2024-05-05 NOTE — Patient Instructions (Signed)
 Please continue your biktarvy  and bactrim  once a day Please continue the azithromycin  once a week until your CD4 comes back   Will have you fill out our dental referral today - the schedule is a little tight.  Here is also a Chiropodist to help find dentists in the area that Medicaid will cover   [place] Dental Providers:

## 2024-05-08 ENCOUNTER — Ambulatory Visit: Payer: Self-pay | Admitting: Infectious Diseases

## 2024-05-09 LAB — HIV-1 RNA QUANT-NO REFLEX-BLD
HIV 1 RNA Quant: <20 {copies}/mL — AB
HIV-1 RNA Quant, Log: <1.3 {Log_copies}/mL — AB

## 2024-05-09 LAB — T-HELPER CELLS (CD4) COUNT (NOT AT ARMC)
Absolute CD4: 64 {cells}/uL — ABNORMAL LOW (ref 490–1740)
CD4 T Helper %: 4 % — ABNORMAL LOW (ref 30–61)
Total lymphocyte count: 1509 {cells}/uL (ref 850–3900)

## 2024-07-28 ENCOUNTER — Other Ambulatory Visit: Payer: Self-pay

## 2024-07-28 ENCOUNTER — Encounter: Payer: Self-pay | Admitting: Infectious Diseases

## 2024-07-28 ENCOUNTER — Ambulatory Visit: Payer: Self-pay | Admitting: Infectious Diseases

## 2024-07-28 VITALS — BP 99/66 | HR 58 | Temp 97.7°F | Ht 75.0 in | Wt 175.0 lb

## 2024-07-28 DIAGNOSIS — R635 Abnormal weight gain: Secondary | ICD-10-CM

## 2024-07-28 DIAGNOSIS — B20 Human immunodeficiency virus [HIV] disease: Secondary | ICD-10-CM

## 2024-07-28 DIAGNOSIS — Z0000 Encounter for general adult medical examination without abnormal findings: Secondary | ICD-10-CM

## 2024-07-28 DIAGNOSIS — Z79899 Other long term (current) drug therapy: Secondary | ICD-10-CM

## 2024-07-28 DIAGNOSIS — Z91148 Patient's other noncompliance with medication regimen for other reason: Secondary | ICD-10-CM

## 2024-07-28 NOTE — Progress Notes (Signed)
 Patient: Jesse Andrews  DOB: 11/18/1995 MRN: [id number] PCP: Pcp, No  Referring Provider:   Reason for Visit: 84-month follow up   Chief Complaint  Patient presents with   Follow-up      Subjective   Subjective:   Jesse Andrews is a 28 year old male presenting to clinic for 7-month follow up and labs. Since his last visit on 05/05/2024, he reports no changes to his health and reports feeling better compared to over the Summer. He reports having normal bowel movements and goes everyday. He denies nausea, vomiting, abdominal pain, diarrhea, and blood in stool. He reports good appetite and states it is a little too good. He is no longer taking Azithromycin  but continues to take his Bactrim  and Biktarvy . He reports daily adherence, no missed doses and adverse effects. Last documented viral load <20 and CD4 <35 from back in September and July. 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 He reports being done with work by 11pm on most nights and endorses good sleep over all. He reports stable housing, food, and transportation. He reports feeling good and no changes in mood and reports it as being stable. He is sexually active with his boyfriend. He reports no condom used. He denies penile discharge, dysuria, lesions/rashes, and itchiness in the genital area. He denies fever, chills, night sweats, chest pain, and shortness of breath. He has no other concerns at this time.    Review of Systems  Constitutional: Negative.   Respiratory: Negative.    Cardiovascular: Negative.   Gastrointestinal: Negative.   Genitourinary: Negative.   Musculoskeletal: Negative.   Skin: Negative.   Neurological: Negative.   Psychiatric/Behavioral: Negative.      Past Medical History:  Diagnosis Date   Chlamydia 2014   Gonorrhea 2014   HIV disease (HCC) 2015   Syphilis     Outpatient Medications Prior to Visit  Medication Sig Dispense Refill    bictegravir-emtricitabine -tenofovir  AF (BIKTARVY ) 50-200-25 MG TABS tablet Take 1 tablet by mouth daily. Try to take at the same time each day with or without food. 30 tablet 5   sulfamethoxazole -trimethoprim  (BACTRIM  DS) 800-160 MG tablet Take 1 tablet by mouth 3 (three) times a week. 12 tablet 5   azithromycin  (ZITHROMAX ) 600 MG tablet Take 2 tablets (1,200 mg total) by mouth once a week. (Patient not taking: Reported on 07/28/2024) 24 tablet 0   fluconazole  (DIFLUCAN ) 200 MG tablet Take 1 tablet (200 mg total) by mouth daily. (Patient not taking: Reported on 07/28/2024) 14 tablet 0   mupirocin  ointment (BACTROBAN ) 2 % Apply 1 application topically 2 (two) times daily. (Patient not taking: Reported on 07/28/2024) 22 g 0   potassium chloride  SA (KLOR-CON  M) 20 MEQ tablet Take 1 tablet (20 mEq total) by mouth daily for 7 days. (Patient not taking: Reported on 07/28/2024) 7 tablet 0   No facility-administered medications prior to visit.     Allergies[1]  Social History[2]  No family history on file.     Objective   Objective:   Vitals:   07/28/24 0953  BP: 99/66  Pulse: (!) 58  Temp: 97.7 F (36.5 C)  TempSrc: Temporal  SpO2: 100%  Weight: 175 lb (79.4 kg)  Height: 6' 3 (1.905 m)   Body mass index is 21.87 kg/m.  Physical Exam Vitals and nursing note reviewed.  Constitutional:      Appearance: Normal appearance.  Cardiovascular:     Rate and Rhythm: Normal  rate and regular rhythm.     Heart sounds: Normal heart sounds.  Pulmonary:     Effort: Pulmonary effort is normal.     Breath sounds: Normal breath sounds.  Musculoskeletal:        General: Normal range of motion.  Neurological:     Mental Status: He is alert and oriented to person, place, and time.  Psychiatric:        Mood and Affect: Mood normal.        Behavior: Behavior normal.        Thought Content: Thought content normal.        Judgment: Judgment normal.        Assessment & Plan:   Problem  List Items Addressed This Visit   None Visit Diagnoses       Human immunodeficiency virus (HIV) disease (HCC)    -  Primary   Relevant Orders   T-helper cells (CD4) count   HIV 1 RNA quant-no reflex-bld   CBC w/Diff     Healthcare maintenance         Weight gain         AIDS (acquired immune deficiency syndrome) (HCC)           Assessment and Plan  -Human immunodeficiency virus (HIV) disease Current regimen includes daily Biktarvy . He reports daily adherence, no missed doses, or adverse effects. He also reports maintaining adherence to Bactrim . He is no longer taking Azithromycin . Last viral load <20 and CD4 <35. Since he is no longer taking Azithromycin , it can be discontinued. Will order CD4 and HIV-1 RNA today as well as CBC with diff. If CD4 greater than 200, we can discontinue Bactrim , for now continue taking Bactrim  and continue with daily Biktarvy .  -Weight gain Reports having a good appetite. Previously he was 140 lbs on 02/2024. He is up 35 lbs, 175 lbs. Good job with weight gain! Continue to monitor weight changes.   -Healthcare maintenance  He reports being sexually active with his partner. STI screening offered and declines at this time. Preventive care reviewed. Anal pap smear at 28 years of age, PSA and colonoscopy at 28 years of age. Immunizations reviewed. Discussed 3/3 HPV, flu, and COVID booster. He reports making a separate appointment next week for vaccines.      Orders Placed This Encounter  Procedures   T-helper cells (CD4) count   HIV 1 RNA quant-no reflex-bld   CBC w/Diff    No orders of the defined types were placed in this encounter.   Return in about 4 months (around 11/26/2024).     [1]  Allergies Allergen Reactions   Penicillin G    Penicillins     Childhood allergy, doesn't know  [2]  Social History Tobacco Use   Smoking status: Never   Smokeless tobacco: Never  Substance Use Topics   Alcohol use: Yes    Alcohol/week: 2.0 standard  drinks of alcohol    Types: 2 Cans of beer per week    Comment: socially   Drug use: Yes    Types: Marijuana

## 2024-07-28 NOTE — Progress Notes (Signed)
 Name: Jesse Andrews  DOB: 11/04/1995 MRN: [id number] PCP: Pcp, No    Brief Narrative:  Jesse Andrews is a 28 y.o. male with HIV, Stage 3, diagnosed in 2015. CD4 nadir <70 VL unknown Transmission Risk: MSM History of OIs: PCP 02/2018 History of STIs: none Hep B sAg (- 2019), sAb (), cAb (); Hep A (), Hep C (- 2019) Quantiferon () HLA B*5701 () G6PD: ()   Previous Regimens: Biktarvy , intermittent adherence 2020 - 2025  Genotypes: 2022 - no resistance noted     Subjective  Chief Complaint  Patient presents with   Follow-up     Discussed the use of AI scribe software for clinical note transcription with the patient, who gave verbal consent to proceed.  History of Present Illness   Jesse Andrews is a 28 year old male with HIV who presents for routine follow-up care.  Jesse Andrews feels great and has regained a significant amount of weight since the summer, when Jesse Andrews had more advanced symptoms and was told Jesse Andrews had untreated HIV and a Shigella infection with bacteremia.  Jesse Andrews is currently experiencing issues with obtaining his medications due to a potential insurance issue. His Medicaid might have needed recertification, affecting his ability to get Bactrim . Jesse Andrews has been without Bactrim  for about two weeks but still has some Biktarvy  left. The cost of Bactrim  was unexpectedly high at $209, which Jesse Andrews cannot afford, whereas Jesse Andrews usually pays $4. Attempts to order Biktarvy  online show a high cost initially, but it disappears.  Jesse Andrews is currently taking Biktarvy  and Bactrim , though Jesse Andrews is running low on both. Jesse Andrews has Medicaid and typically uses [facility] for his prescriptions.  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          05/05/2024   10:12 AM  Depression screen PHQ 2/9  Decreased Interest 0  Down, Depressed, Hopeless 0  PHQ - 2 Score 0    Review of Systems  Constitutional:  Negative for chills, fever, malaise/fatigue  and weight loss.  HENT:  Negative for sore throat.   Respiratory:  Negative for cough, sputum production and shortness of breath.   Gastrointestinal:  Negative for abdominal pain, blood in stool, constipation, diarrhea, heartburn, nausea and vomiting.  Genitourinary: Negative.   Musculoskeletal: Negative.   Skin:  Negative for rash.  Neurological:  Negative for dizziness and headaches.    Past Medical History:  Diagnosis Date   Chlamydia 2014   Gonorrhea 2014   HIV disease (HCC) 2015   Syphilis     Outpatient Medications Prior to Visit  Medication Sig Dispense Refill   bictegravir-emtricitabine -tenofovir  AF (BIKTARVY ) 50-200-25 MG TABS tablet Take 1 tablet by mouth daily. Try to take at the same time each day with or without food. 30 tablet 5   sulfamethoxazole -trimethoprim  (BACTRIM  DS) 800-160 MG tablet Take 1 tablet by mouth 3 (three) times a week. 12 tablet 5   azithromycin  (ZITHROMAX ) 600 MG tablet Take 2 tablets (1,200 mg total) by mouth once a week. (Patient not taking: Reported on 07/28/2024) 24 tablet 0   fluconazole  (DIFLUCAN ) 200 MG tablet Take 1 tablet (200 mg total) by mouth daily. (Patient not taking: Reported on 07/28/2024) 14 tablet 0   mupirocin  ointment (BACTROBAN ) 2 % Apply 1 application topically 2 (two) times daily. (Patient not taking: Reported on 07/28/2024) 22 g 0   potassium chloride  SA (KLOR-CON  M) 20 MEQ tablet Take 1 tablet (20 mEq total) by mouth  daily for 7 days. (Patient not taking: Reported on 07/28/2024) 7 tablet 0   No facility-administered medications prior to visit.     Allergies  Allergen Reactions   Penicillin G    Penicillins     Childhood allergy, doesn't know    Social History   Tobacco Use   Smoking status: Never   Smokeless tobacco: Never  Substance Use Topics   Alcohol use: Yes    Alcohol/week: 2.0 standard drinks of alcohol    Types: 2 Cans of beer per week    Comment: socially   Drug use: Yes    Types: Marijuana      Social History   Substance and Sexual Activity  Sexual Activity Not on file   Comment: declined condoms        Objective  Vitals:   07/28/24 0953  BP: 99/66  Pulse: (!) 58  Temp: 97.7 F (36.5 C)  TempSrc: Temporal  SpO2: 100%  Weight: 175 lb (79.4 kg)  Height: 6' 3 (1.905 m)   Body mass index is 21.87 kg/m.  Physical Exam Constitutional:      Appearance: Normal appearance. Jesse Andrews is not ill-appearing.  HENT:     Head: Normocephalic.     Mouth/Throat:     Mouth: Mucous membranes are moist.     Pharynx: Oropharynx is clear.  Eyes:     General: No scleral icterus. Cardiovascular:     Rate and Rhythm: Normal rate and regular rhythm.  Pulmonary:     Effort: Pulmonary effort is normal.  Abdominal:     General: Bowel sounds are normal. There is no distension.     Palpations: Abdomen is soft.  Musculoskeletal:        General: Normal range of motion.     Cervical back: Normal range of motion.  Skin:    Coloration: Skin is not jaundiced or pale.  Neurological:     Mental Status: Jesse Andrews is alert and oriented to person, place, and time.  Psychiatric:        Mood and Affect: Mood normal.        Judgment: Judgment normal.       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 Previously experienced advanced symptoms from untreated HIV and secondary Shigella infection with bacteremia. Current issue with medication access due to insurance recertification, affecting Bactrim  and Biktarvy  prescriptions. CD4 count expected to respond slowly, necessitating continued Bactrim  use. Biktarvy  is crucial for ongoing treatment, and samples provided to buffer until resolution. - Provided Biktarvy  samples to buffer until prescription issue is resolved. - Assisted in contacting Medicaid case worker to resolve prescription access issues. - Ordered blood work to  assess current status. - Scheduled follow-up in four months to evaluate immune system recovery and adjust follow-up frequency as needed.  Healthcare Maintenance -  flu, menveo and hpv #3 at his ability (will schedule soon d/t other activities planned Jesse Andrews does not want to push through s/e with).  Would also like to get Jesse Andrews vaccinated for shingles given immunosuppressive risk     Insurance Problem -  Coordinated with financial team here to help investigate. I provided Jesse Andrews with documentation on what Jesse Andrews needs to do to resume outpatient Medicaid coverage  2 weeks of samples for Biktarvy  provided to hold Jesse Andrews over and avoid lapse in treatment  Orders Placed This Encounter  Procedures   HIV 1 RNA quant-no reflex-bld   CBC w/Diff   T-helper cells (CD4) count    No orders of the defined types were placed in this encounter.   Return in about 4 months (around 11/26/2024).   Corean Fireman, MSN, NP-C [facility]  [email or web address] Pager: [phone] Office: [phone] [facility] Main Line: [phone] *Secure Chat Communication Welcome

## 2024-07-28 NOTE — Patient Instructions (Addendum)
(  [phone] is the phone number to [facility] department.   It appears your Medicaid is only active for inpatient care at this time for some reason  You will need to call the number to request help to correct your Medicaid services to cover outpatient care again.  They should be able to help you and connect you to your case worker from there.

## 2024-07-31 ENCOUNTER — Ambulatory Visit: Payer: Self-pay | Admitting: Infectious Diseases

## 2024-07-31 LAB — CBC WITH DIFFERENTIAL/PLATELET
Absolute Lymphocytes: 1690 {cells}/uL (ref 850–3900)
Absolute Monocytes: 426 {cells}/uL (ref 200–950)
Basophils Absolute: 10 {cells}/uL (ref 0–200)
Basophils Relative: 0.3 %
Eosinophils Absolute: 29 {cells}/uL (ref 15–500)
Eosinophils Relative: 0.9 %
HCT: 45.4 % (ref 39.4–51.1)
Hemoglobin: 15.2 g/dL (ref 13.2–17.1)
MCH: 29.2 pg (ref 27.0–33.0)
MCHC: 33.5 g/dL (ref 31.6–35.4)
MCV: 87.3 fL (ref 81.4–101.7)
MPV: 9.7 fL (ref 7.5–12.5)
Monocytes Relative: 13.3 %
Neutro Abs: 1046 {cells}/uL — ABNORMAL LOW (ref 1500–7800)
Neutrophils Relative %: 32.7 %
Platelets: 229 Thousand/uL (ref 140–400)
RBC: 5.2 Million/uL (ref 4.20–5.80)
RDW: 12.2 % (ref 11.0–15.0)
Total Lymphocyte: 52.8 %
WBC: 3.2 Thousand/uL — ABNORMAL LOW (ref 3.8–10.8)

## 2024-07-31 LAB — HIV-1 RNA QUANT-NO REFLEX-BLD
HIV 1 RNA Quant: 19300 {copies}/mL — ABNORMAL HIGH
HIV-1 RNA Quant, Log: 4.29 {Log_copies}/mL — ABNORMAL HIGH

## 2024-07-31 LAB — T-HELPER CELLS (CD4) COUNT (NOT AT ARMC)
Absolute CD4: 88 {cells}/uL — ABNORMAL LOW (ref 490–1740)
CD4 T Helper %: 5 % — ABNORMAL LOW (ref 30–61)
Total lymphocyte count: 1769 {cells}/uL (ref 850–3900)

## 2024-08-01 ENCOUNTER — Other Ambulatory Visit: Payer: Self-pay | Admitting: Pharmacist

## 2024-08-01 DIAGNOSIS — B20 Human immunodeficiency virus [HIV] disease: Secondary | ICD-10-CM

## 2024-08-01 MED ORDER — BIKTARVY 50-200-25 MG PO TABS: 1.0000 | ORAL_TABLET | Freq: Every day | ORAL | Status: AC

## 2024-08-01 NOTE — Progress Notes (Signed)
 Medication Samples have been provided to the patient.  Drug name: Biktarvy         Strength: 50/200/25 mg       Qty: 2 bottles (14 tablets)   LOT: [id number]   Exp.Date: 09/16/26  Samples requested by Corean Fireman, NP.  Dosing instructions: Take one tablet by mouth once daily  The patient has been instructed regarding the correct time, dose, and frequency of taking this medication, including desired effects and most common side effects.   Hanford Lust L. Finneas Mathe, PharmD, BCIDP, AAHIVP, CPP Clinical Pharmacist Practitioner Infectious Diseases Clinical Pharmacist [facility]

## 2024-08-01 NOTE — Telephone Encounter (Signed)
-----   Message from Corean Fireman, NP sent at 07/31/2024 11:59 AM EST ----- Can you reach out to Abdifatah to let him know I'd like to have him back for lab visit in 4 weeks or so and he can see further details on mychart result message if he has any questions.

## 2024-08-01 NOTE — Telephone Encounter (Signed)
 Patient Voice mail not set up, Patient needing to schedule a 4 to 6 week lab appointment.

## 2024-08-01 NOTE — Telephone Encounter (Signed)
-----   Message from Corean Fireman, NP sent at 07/31/2024 11:59 AM EST ----- Can you reach out to Jesse Andrews to let him know I'd like to have him back for lab visit in 4 weeks or so and he can see further details on mychart result message if he has any questions.

## 2024-09-26 ENCOUNTER — Ambulatory Visit: Payer: Self-pay | Admitting: Infectious Diseases

## 2024-11-24 ENCOUNTER — Ambulatory Visit: Payer: Self-pay | Admitting: Infectious Diseases
